# Patient Record
Sex: Female | Born: 1975 | Race: Black or African American | Hispanic: No | Marital: Single | Smoking: Never smoker
Health system: Southern US, Community
[De-identification: ages and names within clinical notes are randomized; demographics above are authoritative.]

## PROBLEM LIST (undated history)

## (undated) DIAGNOSIS — T7840XA Allergy, unspecified, initial encounter: Secondary | ICD-10-CM

## (undated) DIAGNOSIS — K589 Irritable bowel syndrome without diarrhea: Secondary | ICD-10-CM

## (undated) HISTORY — PX: WISDOM TOOTH EXTRACTION: SHX21

## (undated) HISTORY — DX: Irritable bowel syndrome, unspecified: K58.9

## (undated) HISTORY — DX: Allergy, unspecified, initial encounter: T78.40XA

---

## 1998-06-20 ENCOUNTER — Other Ambulatory Visit: Admission: RE | Admit: 1998-06-20 | Discharge: 1998-06-20 | Payer: Self-pay | Admitting: *Deleted

## 1998-12-27 ENCOUNTER — Inpatient Hospital Stay (HOSPITAL_COMMUNITY): Admission: AD | Admit: 1998-12-27 | Discharge: 1998-12-29 | Payer: Self-pay | Admitting: Obstetrics and Gynecology

## 2000-02-25 ENCOUNTER — Encounter (INDEPENDENT_AMBULATORY_CARE_PROVIDER_SITE_OTHER): Payer: Self-pay | Admitting: *Deleted

## 2000-02-25 ENCOUNTER — Other Ambulatory Visit: Admission: RE | Admit: 2000-02-25 | Discharge: 2000-02-25 | Payer: Self-pay | Admitting: *Deleted

## 2001-07-25 ENCOUNTER — Other Ambulatory Visit: Admission: RE | Admit: 2001-07-25 | Discharge: 2001-07-25 | Payer: Self-pay | Admitting: Obstetrics and Gynecology

## 2002-10-05 ENCOUNTER — Encounter: Payer: Self-pay | Admitting: Family Medicine

## 2002-12-31 ENCOUNTER — Encounter: Payer: Self-pay | Admitting: Gastroenterology

## 2002-12-31 ENCOUNTER — Ambulatory Visit (HOSPITAL_COMMUNITY): Admission: RE | Admit: 2002-12-31 | Discharge: 2002-12-31 | Payer: Self-pay | Admitting: Gastroenterology

## 2003-04-18 ENCOUNTER — Other Ambulatory Visit: Admission: RE | Admit: 2003-04-18 | Discharge: 2003-04-18 | Payer: Self-pay | Admitting: Obstetrics and Gynecology

## 2003-12-14 ENCOUNTER — Emergency Department (HOSPITAL_COMMUNITY): Admission: EM | Admit: 2003-12-14 | Discharge: 2003-12-15 | Payer: Self-pay | Admitting: Emergency Medicine

## 2004-01-02 ENCOUNTER — Other Ambulatory Visit: Admission: RE | Admit: 2004-01-02 | Discharge: 2004-01-02 | Payer: Self-pay | Admitting: Family Medicine

## 2005-01-26 ENCOUNTER — Other Ambulatory Visit: Admission: RE | Admit: 2005-01-26 | Discharge: 2005-01-26 | Payer: Self-pay | Admitting: Family Medicine

## 2006-02-22 ENCOUNTER — Other Ambulatory Visit: Admission: RE | Admit: 2006-02-22 | Discharge: 2006-02-22 | Payer: Self-pay | Admitting: Family Medicine

## 2006-08-01 ENCOUNTER — Encounter: Payer: Self-pay | Admitting: Family Medicine

## 2006-09-15 ENCOUNTER — Encounter: Payer: Self-pay | Admitting: Family Medicine

## 2007-03-03 ENCOUNTER — Encounter: Payer: Self-pay | Admitting: Family Medicine

## 2007-03-03 ENCOUNTER — Other Ambulatory Visit: Admission: RE | Admit: 2007-03-03 | Discharge: 2007-03-03 | Payer: Self-pay | Admitting: Family Medicine

## 2008-03-04 ENCOUNTER — Other Ambulatory Visit: Admission: RE | Admit: 2008-03-04 | Discharge: 2008-03-04 | Payer: Self-pay | Admitting: Family Medicine

## 2008-12-25 ENCOUNTER — Encounter: Payer: Self-pay | Admitting: Family Medicine

## 2009-04-10 ENCOUNTER — Other Ambulatory Visit: Admission: RE | Admit: 2009-04-10 | Discharge: 2009-04-10 | Payer: Self-pay | Admitting: Family Medicine

## 2009-04-10 ENCOUNTER — Encounter: Payer: Self-pay | Admitting: Family Medicine

## 2009-09-03 ENCOUNTER — Encounter: Payer: Self-pay | Admitting: Family Medicine

## 2010-04-15 ENCOUNTER — Encounter: Payer: Self-pay | Admitting: Family Medicine

## 2010-04-15 ENCOUNTER — Ambulatory Visit: Payer: Self-pay | Admitting: Family Medicine

## 2010-04-15 ENCOUNTER — Encounter (INDEPENDENT_AMBULATORY_CARE_PROVIDER_SITE_OTHER): Payer: Self-pay | Admitting: *Deleted

## 2010-04-15 ENCOUNTER — Other Ambulatory Visit
Admission: RE | Admit: 2010-04-15 | Discharge: 2010-04-15 | Payer: Self-pay | Source: Home / Self Care | Admitting: Family Medicine

## 2010-04-15 DIAGNOSIS — K589 Irritable bowel syndrome without diarrhea: Secondary | ICD-10-CM

## 2010-04-15 DIAGNOSIS — J309 Allergic rhinitis, unspecified: Secondary | ICD-10-CM | POA: Insufficient documentation

## 2010-04-16 ENCOUNTER — Telehealth: Payer: Self-pay | Admitting: Family Medicine

## 2010-04-16 LAB — CONVERTED CEMR LAB: HIV: NONREACTIVE

## 2010-04-27 LAB — CONVERTED CEMR LAB: Pap Smear: NEGATIVE

## 2010-06-02 ENCOUNTER — Encounter: Payer: Self-pay | Admitting: Gastroenterology

## 2010-06-02 ENCOUNTER — Other Ambulatory Visit: Payer: Self-pay | Admitting: Gastroenterology

## 2010-06-02 ENCOUNTER — Ambulatory Visit
Admission: RE | Admit: 2010-06-02 | Discharge: 2010-06-02 | Payer: Self-pay | Source: Home / Self Care | Attending: Gastroenterology | Admitting: Gastroenterology

## 2010-06-02 DIAGNOSIS — K5900 Constipation, unspecified: Secondary | ICD-10-CM | POA: Insufficient documentation

## 2010-06-02 LAB — CBC WITH DIFFERENTIAL/PLATELET
Basophils Absolute: 0 10*3/uL (ref 0.0–0.1)
Basophils Relative: 0.7 % (ref 0.0–3.0)
Eosinophils Absolute: 0.1 10*3/uL (ref 0.0–0.7)
Eosinophils Relative: 1.4 % (ref 0.0–5.0)
HCT: 33 % — ABNORMAL LOW (ref 36.0–46.0)
Hemoglobin: 11.2 g/dL — ABNORMAL LOW (ref 12.0–15.0)
Lymphocytes Relative: 40.9 % (ref 12.0–46.0)
Lymphs Abs: 1.8 10*3/uL (ref 0.7–4.0)
MCHC: 33.8 g/dL (ref 30.0–36.0)
MCV: 90.7 fl (ref 78.0–100.0)
Monocytes Absolute: 0.4 10*3/uL (ref 0.1–1.0)
Monocytes Relative: 8.2 % (ref 3.0–12.0)
Neutro Abs: 2.1 10*3/uL (ref 1.4–7.7)
Neutrophils Relative %: 48.8 % (ref 43.0–77.0)
Platelets: 307 10*3/uL (ref 150.0–400.0)
RBC: 3.64 Mil/uL — ABNORMAL LOW (ref 3.87–5.11)
RDW: 14.5 % (ref 11.5–14.6)
WBC: 4.4 10*3/uL — ABNORMAL LOW (ref 4.5–10.5)

## 2010-06-02 LAB — COMPREHENSIVE METABOLIC PANEL
ALT: 12 U/L (ref 0–35)
AST: 18 U/L (ref 0–37)
Albumin: 3.9 g/dL (ref 3.5–5.2)
Alkaline Phosphatase: 36 U/L — ABNORMAL LOW (ref 39–117)
BUN: 8 mg/dL (ref 6–23)
CO2: 26 mEq/L (ref 19–32)
Calcium: 8.9 mg/dL (ref 8.4–10.5)
Chloride: 109 mEq/L (ref 96–112)
Creatinine, Ser: 0.7 mg/dL (ref 0.4–1.2)
GFR: 127.42 mL/min (ref >60.00)
Glucose, Bld: 71 mg/dL (ref 70–99)
Potassium: 3.5 mEq/L (ref 3.5–5.1)
Sodium: 139 mEq/L (ref 135–145)
Total Bilirubin: 1.3 mg/dL — ABNORMAL HIGH (ref 0.3–1.2)
Total Protein: 7.7 g/dL (ref 6.0–8.3)

## 2010-06-03 LAB — T4, FREE: Free T4: 0.79 ng/dL (ref 0.60–1.60)

## 2010-06-03 LAB — TSH: TSH: 1.79 u[IU]/mL (ref 0.35–5.50)

## 2010-06-08 ENCOUNTER — Telehealth: Payer: Self-pay | Admitting: Gastroenterology

## 2010-06-10 ENCOUNTER — Encounter: Payer: Self-pay | Admitting: Gastroenterology

## 2010-06-10 ENCOUNTER — Ambulatory Visit
Admission: RE | Admit: 2010-06-10 | Discharge: 2010-06-10 | Payer: Self-pay | Source: Home / Self Care | Attending: Gastroenterology | Admitting: Gastroenterology

## 2010-06-28 LAB — CONVERTED CEMR LAB
HDL: 49 mg/dL
LDL Cholesterol: 112 mg/dL

## 2010-06-30 NOTE — Letter (Signed)
SummaryDeboraha Ray at [facility] at [facility]   Imported By: Lester [place] 05/02/2010 10:55:30  _____________________________________________________________________  External Attachment:    Type:   Image     Comment:   External Document

## 2010-06-30 NOTE — Assessment & Plan Note (Signed)
Summary: new patient, establish care   Vital Signs:  Patient profile:   35 year old female Height:      63.75 inches Weight:      156 pounds BMI:     27.09 Temp:     98.5 degrees F oral Pulse rate:   72 / minute Pulse rhythm:   regular BP sitting:   110 / 80  (right arm) Cuff size:   regular  Vitals Entered By: Linde Gillis CMA Duncan Dull) (April 15, 2010 11:11 AM) CC: new patient, establish care   History of Present Illness: 35 yo here to establish care and for CPX.  G1P1, no h/o abnormal pap smears.  IBS- was diagnosed with IBS several years ago.  Symptoms are mainly chronic constipation.  Had a barium swallow through [facility] 8 years ago, normal.  Fiber supplements do help.   Tried IBS meds, did not help much.  Since July 2010- concerned that her stool has an odor. Color is normal.  no abdominal pain or blood in stool but is becoming very concerning.  Current Medications (verified): 1)  None  Allergies (verified): No Known Drug Allergies  Past History:  Family History: Last updated: 04/15/2010 Unremarkable  Social History: Last updated: 04/15/2010 One son, Heloise Purpura. Recently moved to [place]  Past Medical History: Allergic rhinitis IBS  Past Surgical History: Denies surgical history  Family History: Unremarkable  Social History: One son, Heloise Purpura. Recently moved to [place]  Review of Systems      See HPI General:  Denies fever and malaise. Eyes:  Denies blurring. ENT:  Denies difficulty swallowing. CV:  Denies chest pain or discomfort. Resp:  Denies shortness of breath. GI:  Complains of constipation; denies abdominal pain, nausea, and vomiting. GU:  Denies abnormal vaginal bleeding, discharge, and dysuria. MS:  Denies joint pain, joint redness, and joint swelling. Derm:  Denies rash. Neuro:  Denies headaches. Psych:  Denies anxiety and depression. Endo:  Denies cold intolerance and heat intolerance. Heme:  Denies abnormal bruising and  enlarge lymph nodes.  Physical Exam  General:  Well-developed,well-nourished,in no acute distress; alert,appropriate and cooperative throughout examination Head:  normocephalic and atraumatic.   Eyes:  vision grossly intact, pupils equal, and pupils round.   Ears:  R ear normal and L ear normal.   Nose:  no external deformity.   Mouth:  good dentition.   Neck:  No deformities, masses, or tenderness noted. Lungs:  Normal respiratory effort, chest expands symmetrically. Lungs are clear to auscultation, no crackles or wheezes. Heart:  Normal rate and regular rhythm. S1 and S2 normal without gallop, murmur, click, rub or other extra sounds. Abdomen:  Bowel sounds positive,abdomen soft and non-tender without masses, organomegaly or hernias noted. Rectal:  no hemorrhoids.   Genitalia:  Pelvic Exam:        External: normal female genitalia without lesions or masses        Vagina: normal without lesions or masses        Cervix: normal without lesions or masses        Adnexa: normal bimanual exam without masses or fullness        Uterus: normal by palpation        Pap smear: performed Msk:  No deformity or scoliosis noted of thoracic or lumbar spine.   Extremities:  no edema Neurologic:  alert & oriented X3 and gait normal.   Skin:  Intact without suspicious lesions or rashes Psych:  Cognition and judgment appear intact. Alert and  cooperative with normal attention span and concentration. No apparent delusions, illusions, hallucinations   Impression & Recommendations:  Problem # 1:  Preventive Health Care (ICD-V70.0) Reviewed preventive care protocols, scheduled due services, and updated immunizations Discussed nutrition, exercise, diet, and healthy lifestyle. Pap today with GC/Chlamydia.  Problem # 2:  SCREENING EXAMINATION FOR VENEREAL DISEASE (ICD-V74.5) HIV, RPR. Orders: Venipuncture ([address]) T-HIV Antibody  (Reflex) [id number]) T-RPR (Syphilis) ([id number])  Problem # 3:  IBS  (ICD-564.1) Assessment: Deteriorated GI referral for second opinion. Orders: Gastroenterology Referral (GI)  Patient Instructions: 1)  It was so nice to meet you. 2)  Please say hi to [facility] for me.   3)  Please stop by to see Shirlee Limerick on your way out.   Orders Added: 1)  Venipuncture [13086] 2)  T-HIV Antibody  (Reflex) [[id number]] 3)  T-RPR (Syphilis) [[id number]] 4)  Gastroenterology Referral [GI] 5)  New Patient 18-39 years [[address]]    Prior Medications (reviewed today): None Current Allergies (reviewed today): No known allergies   HDL Result Date:  09/03/2009 HDL Result:  49 LDL Result Date:  09/03/2009 LDL Result:  112   Appended Document: new patient, establish care

## 2010-06-30 NOTE — Letter (Signed)
Summary: New Patient letter  [facility]  [address] [place], [place] [address]   Phone: [phone]  Fax: [phone]       04/15/2010 MRN: [id number]  [facility] [address] [place], [place]  [address]  Dear Ms. Warriner,  Welcome to the Gastroenterology Division at [facility].    You are scheduled to see Dr.  Arlyce Dice on 06-02-10 at 3:45p.m. on the 3rd floor at [facility], [address]  We ask that you try to arrive at our office 15 minutes prior to your appointment time to allow for check-in.  We would like you to complete the enclosed self-administered evaluation form prior to your visit and bring it with you on the day of your appointment.  We will review it with you.  Also, please bring a complete list of all your medications or, if you prefer, bring the medication bottles and we will list them.  Please bring your insurance card so that we may make a copy of it.  If your insurance requires a referral to see a specialist, please bring your referral form from your primary care physician.  Co-payments are due at the time of your visit and may be paid by cash, check or credit card.     Your office visit will consist of a consult with your physician (includes a physical exam), any laboratory testing he/she may order, scheduling of any necessary diagnostic testing (e.g. x-ray, ultrasound, CT-scan), and scheduling of a procedure (e.g. Endoscopy, Colonoscopy) if required.  Please allow enough time on your schedule to allow for any/all of these possibilities.    If you cannot keep your appointment, please call [phone] to cancel or reschedule prior to your appointment date.  This allows [place] the opportunity to schedule an appointment for another patient in need of care.  If you do not cancel or reschedule by 5 p.m. the business day prior to your appointment date, you will be charged a $50.00 late cancellation/no-show fee.    Thank you for choosing  [facility] for your medical needs.  We appreciate the opportunity to care for you.  Please visit [place] at our website  to learn more about our practice.                     Sincerely,                                                             The Gastroenterology Division

## 2010-06-30 NOTE — Progress Notes (Signed)
Summary: ? yeast infection  Phone Note Call from Patient Call back at Work Phone [phone]   Caller: Patient Call For: Ruthe Mannan MD Summary of Call: Patietn was seen yesterday and had pelvic exam and she says she was asked about concerns of a yeast infection and now is having some of these symptoms. Want something called in to [facility] [address] Initial call taken by: Benny Lennert CMA Duncan Dull),  April 16, 2010 1:41 PM    New/Updated Medications: DIFLUCAN 150 MG TABS (FLUCONAZOLE) once daily Prescriptions: DIFLUCAN 150 MG TABS (FLUCONAZOLE) once daily  #1 x 0   Entered and Authorized by:   Ruthe Mannan MD   Signed by:   Ruthe Mannan MD on 04/16/2010   Method used:   Electronically to        [facility] [phone]* (retail)       [address]       [place], [place]  [address]       Ph: [phone]       Fax: [phone]   RxID:   [id number] DIFLUCAN 150 MG TABS (FLUCONAZOLE) once daily  #1 x 0   Entered and Authorized by:   Ruthe Mannan MD   Signed by:   Ruthe Mannan MD on 04/16/2010   Method used:   Electronically to        [facility] (retail)       [address], [address]       [place], [place]  [address]       Ph: [phone]       Fax: [phone]   RxID:   [id number]

## 2010-06-30 NOTE — Letter (Signed)
Summary: [facility]  [facility]   Imported By: Lester [place] 05/07/2010 12:51:44  _____________________________________________________________________  External Attachment:    Type:   Image     Comment:   External Document

## 2010-06-30 NOTE — Letter (Signed)
Summary: Sigmund Hazel MD  Sigmund Hazel MD   Imported By: Lester [place] 05/02/2010 10:56:27  _____________________________________________________________________  External Attachment:    Type:   Image     Comment:   External Document

## 2010-06-30 NOTE — Letter (Signed)
Summary: Sigmund Hazel MD  Sigmund Hazel MD   Imported By: Lester [place] 05/02/2010 10:58:31  _____________________________________________________________________  External Attachment:    Type:   Image     Comment:   External Document

## 2010-06-30 NOTE — Letter (Signed)
Summary: Suzanne Ray records 2004  [facility] records 2004   Imported By: Lester [place] 05/07/2010 12:53:59  _____________________________________________________________________  External Attachment:    Type:   Image     Comment:   External Document

## 2010-06-30 NOTE — Letter (Signed)
Summary: Sigmund Hazel MD  Sigmund Hazel MD   Imported By: Lester [place] 05/02/2010 10:57:17  _____________________________________________________________________  External Attachment:    Type:   Image     Comment:   External Document

## 2010-07-02 NOTE — Letter (Signed)
Summary: Appt Reminder 2  [facility]  [address] [place], [place] [address]   Phone: [phone]  Fax: [phone]        June 10, 2010 MRN: [id number]    Suzanne Ray [address] [place], [place]  [address]    Dear [name],   You have a return appointment with Dr. Arlyce Dice on 07/17/10 at 9:45am.  Please remember to bring a complete list of the medicines you are taking, your insurance card and your co-pay.  If you have to cancel or reschedule this appointment, please call before 5:00 pm the evening before to avoid a cancellation fee.  If you have any questions or concerns, please call [phone].    Sincerely,    Selinda Michaels RN  Appended Document: Appt Reminder 2 Letter is mailed to the patient's home address

## 2010-07-02 NOTE — Procedures (Signed)
Summary: Colonoscopy  Patient: Genifer Lazenby Note: All result statuses are Final unless otherwise noted.  Tests: (1) Colonoscopy (COL)   COL Colonoscopy           DONE     [facility]     [address]     [place], [place]  [address]           COLONOSCOPY PROCEDURE REPORT           PATIENT:  Paullette, Mckain  MR#:  [id number]     BIRTHDATE:  06-04-1975, 34 yrs. old  GENDER:  female           ENDOSCOPIST:  Barbette Hair. Arlyce Dice, MD     Referred by:  Ruthe Mannan, M.D.           PROCEDURE DATE:  06/10/2010     PROCEDURE:  Diagnostic Colonoscopy     ASA CLASS:  Class I     INDICATIONS:  1) constipation           MEDICATIONS:   MAC sedation, administered by CRNA           DESCRIPTION OF PROCEDURE:   After the risks benefits and     alternatives of the procedure were thoroughly explained, informed     consent was obtained.  Digital rectal exam was performed and     revealed no abnormalities.   The LB [id number] endoscope     was introduced through the anus and advanced to the cecum, which     was identified by the ileocecal valve, without limitations.  The     quality of the prep was excellent, using MoviPrep.  The instrument     was then slowly withdrawn as the colon was fully examined.     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   Retroflexed views in the     rectum revealed no abnormalities.    The time to cecum =  3.0     minutes. The scope was then withdrawn (time =  6.50  min) from the     patient and the procedure completed.           COMPLICATIONS:  None           ENDOSCOPIC IMPRESSION:     1) Melanosis     2) Otherwise normal examination     RECOMMENDATIONS:     1) pt will consider enrollment in a constipation study     2) call office next  1-3 days to schedule followup visit in 3-4     weeks           REPEAT EXAM:  No           ______________________________     Barbette Hair. Arlyce Dice, MD           CC:           n.     eSIGNED:   Barbette Hair. Alden Feagan at 06/10/2010 02:53 PM           Lily Peer, [id number]  Note: An exclamation mark (!) indicates a result that was not dispersed into the flowsheet. Document Creation Date: 06/10/2010 2:53 PM _______________________________________________________________________  (1)  Order result status: Final Collection or observation date-time: 06/10/2010 14:47 Requested date-time:  Receipt date-time:  Reported date-time:  Referring Physician:   Ordering Physician: Melvia Heaps [phone]) Specimen Source:  Source: Launa Grill Order Number: [id number] Lab site:

## 2010-07-02 NOTE — Progress Notes (Signed)
Summary: Medication  Phone Note Call from Patient Call back at Work Phone [phone]   Caller: Patient Call For: Dr. Arlyce Dice Reason for Call: Talk to Nurse Summary of Call: Pt needs colon prep called in to [facility] [address] Initial call taken by: [place] Johnson,  June 08, 2010 9:35 AM    New/Updated Medications: MOVIPREP 100 GM SOLR (PEG-KCL-NACL-NASULF-NA ASC-C) Take as directed Prescriptions: MOVIPREP 100 GM SOLR (PEG-KCL-NACL-NASULF-NA ASC-C) Take as directed  #1 x 0   Entered by:   Selinda Michaels RN   Authorized by:   Louis Meckel MD   Signed by:   Selinda Michaels RN on 06/08/2010   Method used:   Electronically to        [facility] [phone]* (retail)       [address]       [place], [place]  [address]       Ph: [phone]       Fax: [phone]   RxID:   [id number]

## 2010-07-02 NOTE — Letter (Signed)
Summary: Results Letter   Gastroenterology  [address] [place], [place] [address]   Phone: [phone]  Fax: [phone]        June 02, 2010 MRN: [id number]    EXILDA WILHITE [address] [place], [place]  [address]    Dear Ms. Birt,  It is my pleasure to have treated you recently as a new patient in my office. I appreciate your confidence and the opportunity to participate in your care.  Since I do have a busy inpatient endoscopy schedule and office schedule, my office hours vary weekly. I am, however, available for emergency calls everyday through my office. If I am not available for an urgent office appointment, another one of our gastroenterologist will be able to assist you.  My well-trained staff are prepared to help you at all times. For emergencies after office hours, a physician from our Gastroenterology section is always available through my 24 hour answering service  Once again I welcome you as a new patient and I look forward to a happy and healthy relationship             Sincerely,  Louis Meckel MD  This letter has been electronically signed by your physician.  Appended Document: Results Letter letter mailed

## 2010-07-02 NOTE — Assessment & Plan Note (Signed)
Summary: IBS--ch.   History of Present Illness Visit Type: Initial Consult Primary GI MD: Melvia Heaps MD [facility] Primary Provider: Ruthe Mannan, MD Requesting Provider: Ruthe Mannan, MD Chief Complaint: Patient complains of constipation now she has no urge to have a BM. She feels like she has delayed gastric emptying but no dx. She has some hemrrhoids and gas and bloating. She also complains of alot of epigastric buring and pain History of Present Illness:   [name] is a pleasant 35 year old Afro-American female referred at the request of Dr. Jovita Gamma for evaluation of constipation.  This has ben  a problem for years and is worsening.  She may go 3-4 weeks without  the urge to move her bowels.  With constipation she develops abdominal fullness and postprandial burning discomfort in her lower abdomen.  On occasion she has had minimal amounts of blood per rectum.  She complains of excess gas and bloating.  She has taken amitiza in the past without relief although she claims that she has not taken it regularly.  A barium enema from 2004 was normal.   GI Review of Systems    Reports abdominal pain, acid reflux, belching, bloating, loss of appetite, and  nausea.     Location of  Abdominal pain: epigastric area.    Denies chest pain, dysphagia with liquids, dysphagia with solids, heartburn, vomiting, vomiting blood, weight loss, and  weight gain.      Reports constipation, hemorrhoids, and  irritable bowel syndrome.     Denies anal fissure, black tarry stools, change in bowel habit, diarrhea, diverticulosis, fecal incontinence, heme positive stool, jaundice, light color stool, liver problems, rectal bleeding, and  rectal pain. Preventive Screening-Counseling & Management  Alcohol-Tobacco     Smoking Status: never      Drug Use:  no.      Current Medications (verified): 1)  Nullo 100 Mg Tabs (Chlorophyll) .... Take One By Mouth Three Times A Week.  Allergies (verified): No Known Drug  Allergies  Past History:  Past Medical History: Reviewed history from 04/15/2010 and no changes required. Allergic rhinitis IBS  Past Surgical History: Reviewed history from 04/15/2010 and no changes required. Denies surgical history  Family History: Reviewed history from 04/15/2010 and no changes required. Unremarkable No FH of Colon Cancer:  Social History: One son, Suzanne Ray. 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  Alcohol Use - no Daily Caffeine Use 2 per day Illicit Drug Use - no Smoking Status:  never Drug Use:  no  Review of Systems       The patient complains of fatigue, menstrual pain, shortness of breath, skin rash, and thirst - excessive.  The patient denies allergy/sinus, anemia, anxiety-new, arthritis/joint pain, back pain, blood in urine, breast changes/lumps, change in vision, confusion, cough, coughing up blood, depression-new, fainting, fever, headaches-new, hearing problems, heart murmur, heart rhythm changes, itching, muscle pains/cramps, night sweats, nosebleeds, pregnancy symptoms, sleeping problems, sore throat, swelling of feet/legs, swollen lymph glands, thirst - excessive , urination - excessive , urination changes/pain, urine leakage, vision changes, and voice change.         All other systems were reviewed and were negative   Vital Signs:  Patient profile:   35 year old female Height:      63.75 inches Weight:      158.4 pounds BMI:     27.50 Pulse rate:   70 / minute Pulse rhythm:   regular BP sitting:   100 / 66  (  left arm) Cuff size:   regular  Vitals Entered By: Harlow Mares CMA Duncan Dull) (June 02, 2010 4:12 PM)  Physical Exam  Additional Exam:  On physical exam she is a well-developed well-nourished female  skin: anicteric HEENT: normocephalic; PEERLA; no nasal or pharyngeal abnormalities neck: supple nodes: no cervical lymphadenopathy chest: clear to ausculatation and percussion heart: no  murmurs, gallops, or rubs abd: soft, nontender; BS normoactive; no abdominal masses, tenderness, organomegaly rectal: deferred ext: no cynanosis, clubbing, edema skeletal: no deformities neuro: oriented x 3; no focal abnormalities    Impression & Recommendations:  Problem # 1:  CONSTIPATION (ICD-564.00)  This most likely is due to functional constipation.  A structural abnormality  including IBD should be ruled out.  Rarely inflammation can cause constipation rather than diarrhea.  Hirschsprung's disease is unlikely.  Clinically she does not appear to be hypothyroid.  Recommendations #1 check thyroid function test, CBC and comprehensive metabolic profile #2 colonoscopy; the patient will be sedated with propofol #3 if numbers one and 2 are normal I will work on a bowel regimen with her.  She will also consider enrollment  in a constipation trial  Other Orders: TLB-CBC Platelet - w/Differential (85025-CBCD) TLB-CMP (Comprehensive Metabolic Pnl) (80053-COMP) TLB-TSH (Thyroid Stimulating Hormone) (84443-TSH) TLB-T4 (Thyrox), Free [phone]) Colonoscopy (Colon)  Patient Instructions: 1)  Copy sent to : Ruthe Mannan, MD 2)  Your Colonoscopy is scheduled on 06/10/2010 at 2:30pm 3)  We will send in yourMoviPrep to your pharmacy today 4)  Colonoscopy and Flexible Sigmoidoscopy brochure given.  5)  Conscious Sedation brochure given.  6)  The medication list was reviewed and reconciled.  All changed / newly prescribed medications were explained.  A complete medication list was provided to the patient / caregiver.

## 2010-07-02 NOTE — Letter (Signed)
Summary: [facility] Instructions  [facility]  [address] [place], [place] [address]   Phone: [phone]  Fax: [phone]       Suzanne Ray    10/10/1975    MRN: [id number]        Procedure Day /Date:WEDNESDAY 06/10/2010     Arrival Time:1:30PM     Procedure Time:2:30PM     Location of Procedure:                    X   [facility] (4th Floor)  PREPARATION FOR COLONOSCOPY WITH MOVIPREP   Starting 5 days prior to your procedure 06/05/2010 do not eat nuts, seeds, popcorn, corn, beans, peas,  salads, or any raw vegetables.  Do not take any fiber supplements (e.g. Metamucil, Citrucel, and Benefiber).  THE DAY BEFORE YOUR PROCEDURE         DATE:06/09/2010  DAY: TUESDAY  1.  Drink clear liquids the entire day-NO SOLID FOOD  2.  Do not drink anything colored red or purple.  Avoid juices with pulp.  No orange juice.  3.  Drink at least 64 oz. (8 glasses) of fluid/clear liquids during the day to prevent dehydration and help the prep work efficiently.  CLEAR LIQUIDS INCLUDE: Water Jello Ice Popsicles Tea (sugar ok, no milk/cream) Powdered fruit flavored drinks Coffee (sugar ok, no milk/cream) Gatorade Juice: apple, white grape, white cranberry  Lemonade Clear bullion, consomm, broth Carbonated beverages (any kind) Strained chicken noodle soup Hard Candy                             4.  In the morning, mix first dose of MoviPrep solution:    Empty 1 Pouch A and 1 Pouch B into the disposable container    Add lukewarm drinking water to the top line of the container. Mix to dissolve    Refrigerate (mixed solution should be used within 24 hrs)  5.  Begin drinking the prep at 5:00 p.m. The MoviPrep container is divided by 4 marks.   Every 15 minutes drink the solution down to the next mark (approximately 8 oz) until the full liter is complete.   6.  Follow completed prep with 16 oz of clear liquid of your choice (Nothing red or purple).  Continue  to drink clear liquids until bedtime.  7.  Before going to bed, mix second dose of MoviPrep solution:    Empty 1 Pouch A and 1 Pouch B into the disposable container    Add lukewarm drinking water to the top line of the container. Mix to dissolve    Refrigerate  THE DAY OF YOUR PROCEDURE      DATE:06/10/2010 DAY: WEDNESDAY  Beginning at9:30a.m. (5 hours before procedure):         1. Every 15 minutes, drink the solution down to the next mark (approx 8 oz) until the full liter is complete.  2. Follow completed prep with 16 oz. of clear liquid of your choice.    3. You may drink clear liquids until 12:30PM (2 HOURS BEFORE PROCEDURE).   MEDICATION INSTRUCTIONS  Unless otherwise instructed, you should take regular prescription medications with a small sip of water   as early as possible the morning of your procedure.          OTHER INSTRUCTIONS  You will need a responsible adult at least 34 years of age to accompany you and drive you  home.   This person must remain in the waiting room during your procedure.  Wear loose fitting clothing that is easily removed.  Leave jewelry and other valuables at home.  However, you may wish to bring a book to read or  an iPod/MP3 player to listen to music as you wait for your procedure to start.  Remove all body piercing jewelry and leave at home.  Total time from sign-in until discharge is approximately 2-3 hours.  You should go home directly after your procedure and rest.  You can resume normal activities the  day after your procedure.  The day of your procedure you should not:   Drive   Make legal decisions   Operate machinery   Drink alcohol   Return to work  You will receive specific instructions about eating, activities and medications before you leave.    The above instructions have been reviewed and explained to me by   _______________________    I fully understand and can verbalize these instructions  _____________________________ Date _________

## 2010-12-16 ENCOUNTER — Encounter: Payer: Self-pay | Admitting: Family Medicine

## 2010-12-16 LAB — HM PAP SMEAR

## 2010-12-16 LAB — HM COLONOSCOPY

## 2010-12-17 ENCOUNTER — Other Ambulatory Visit: Payer: Self-pay | Admitting: Family Medicine

## 2010-12-17 ENCOUNTER — Encounter: Payer: Self-pay | Admitting: Family Medicine

## 2010-12-17 ENCOUNTER — Ambulatory Visit (INDEPENDENT_AMBULATORY_CARE_PROVIDER_SITE_OTHER): Payer: Managed Care, Other (non HMO) | Admitting: Family Medicine

## 2010-12-17 VITALS — BP 130/80 | HR 76 | Temp 97.5°F | Wt 159.0 lb

## 2010-12-17 DIAGNOSIS — R195 Other fecal abnormalities: Secondary | ICD-10-CM

## 2010-12-17 DIAGNOSIS — K5900 Constipation, unspecified: Secondary | ICD-10-CM

## 2010-12-17 NOTE — Progress Notes (Signed)
35 yo here to discuss constipation and abnormal stool color and systemic odor.  Saw GI earlier this year, diagnosed with constipation. Colonoscopy was unremarkable in 05/2010. Offered clinical constipation study but she was not willing to go on OCPs for the study.  Constipation improved with Magnesium supplements but still fills like she give out a strange odor, not necessarily in her stool. She feels it is coming out of her pours. Worse after she eats proteins, meats, fish.  Stool sometimes has a lot of mucous in it. No abdominal pain.   Patient Active Problem List  Diagnoses   ALLERGIC RHINITIS   IBS   CONSTIPATION   Past Medical History  Diagnosis Date   Allergy    IBS (irritable bowel syndrome)    No past surgical history on file. History  Substance Use Topics   Smoking status: Never Smoker    Smokeless tobacco: Not on file   Alcohol Use: No   No family history on file. Allergies not on file Current Outpatient Prescriptions on File Prior to Visit  Medication Sig Dispense Refill   Chlorophyll (O) 100 MG TABS Take one tablet by mouth three times a week        The PMH, PSH, Social History, Family History, Medications, and allergies have been reviewed in [facility], and have been updated if relevant.  Review of Systems       See HPI General:  Denies fever and malaise. Eyes:  Denies blurring. ENT:  Denies difficulty swallowing. CV:  Denies chest pain or discomfort. Resp:  Denies shortness of breath. GI:  Complains of constipation; denies abdominal pain, nausea, and vomiting. GU:  Denies abnormal vaginal bleeding, discharge, and dysuria. MS:  Denies joint pain, joint redness, and joint swelling. Derm:  Denies rash. Neuro:  Denies headaches. Psych:  Denies anxiety and depression. Endo:  Denies cold intolerance and heat intolerance. Heme:  Denies abnormal bruising and enlarge lymph nodes.  Physical Exam BP 130/80   Pulse 76   Temp(Src) 97.5 F (36.4 C)  (Oral)   Wt 159 lb (72.122 kg)  General:  Well-developed,well-nourished,in no acute distress; alert,appropriate and cooperative throughout examination Head:  normocephalic and atraumatic.   Eyes:  vision grossly intact, pupils equal, and pupils round.   Ears:  R ear normal and L ear normal.   Nose:  no external deformity.   Mouth:  good dentition.   Neck:  No deformities, masses, or tenderness noted. Lungs:  Normal respiratory effort, chest expands symmetrically. Lungs are clear to auscultation, no crackles or wheezes. Heart:  Normal rate and regular rhythm. S1 and S2 normal without gallop, murmur, click, rub or other extra sounds. Abdomen:  Bowel sounds positive,abdomen soft and non-tender without masses, organomegaly or hernias noted. Msk:  No deformity or scoliosis noted of thoracic or lumbar spine.   Extremities:  no edema Neurologic:  alert & oriented X3 and gait normal.   Skin:  Intact without suspicious lesions or rashes Psych:  Cognition and judgment appear intact. Alert and cooperative with normal attention span and concentration. No apparent delusions, illusions, hallucinations  Assessment and Plan: 1. CONSTIPATION   with abnormal body odor. I cannot appreciate abnormal odor. I question if she has some type of protein or fat malabsorption issue. Has follow up scheduled with Dr. Arlyce Dice in September. Will order fecal fat, LFTs and BMet today. Orders Placed This Encounter  Procedures   Fecal fat, qualtitative   Hepatic function panel   Basic Metabolic Panel (BMET)

## 2010-12-17 NOTE — Patient Instructions (Signed)
Great to see you. I will be in touch by the end of the week!

## 2010-12-18 LAB — HEPATIC FUNCTION PANEL
Alkaline Phosphatase: 36 U/L — ABNORMAL LOW (ref 39–117)
Bilirubin, Direct: 0 mg/dL (ref 0.0–0.3)
Total Bilirubin: 0.5 mg/dL (ref 0.3–1.2)

## 2010-12-18 LAB — BASIC METABOLIC PANEL
BUN: 12 mg/dL (ref 6–23)
CO2: 27 mEq/L (ref 19–32)
Chloride: 107 mEq/L (ref 96–112)
Creatinine, Ser: 0.9 mg/dL (ref 0.4–1.2)
Potassium: 3.6 mEq/L (ref 3.5–5.1)

## 2011-02-03 ENCOUNTER — Ambulatory Visit (INDEPENDENT_AMBULATORY_CARE_PROVIDER_SITE_OTHER): Payer: Managed Care, Other (non HMO) | Admitting: Gastroenterology

## 2011-02-03 ENCOUNTER — Encounter: Payer: Self-pay | Admitting: Gastroenterology

## 2011-02-03 DIAGNOSIS — K5900 Constipation, unspecified: Secondary | ICD-10-CM

## 2011-02-03 DIAGNOSIS — K3189 Other diseases of stomach and duodenum: Secondary | ICD-10-CM | POA: Insufficient documentation

## 2011-02-03 DIAGNOSIS — R1013 Epigastric pain: Secondary | ICD-10-CM

## 2011-02-03 NOTE — Assessment & Plan Note (Addendum)
Abdominal discomfort could be due to ulcer or nonulcer dyspepsia.  Recommendations #1 upper endoscopy #2 gastric emptying scan if endoscopy is negative

## 2011-02-03 NOTE — Progress Notes (Signed)
History of Present Illness:  Suzanne Ray has returned for ongoing complaints of constipation and upper abdominal discomfort. She complains of postprandial fullness with occasional nausea and upper abdominal discomfort. She denies pyrosis, per se. She is concerned about H. pylori infection. On daily magnesium supplementation she is moving her bowels slightly more frequently. She still concerned about abdominal pain when she eats certain foods or drinks alcohol. Recent stool studies were negative for fecal fats    Review of Systems: Pertinent positive and negative review of systems were noted in the above HPI section. All other review of systems were otherwise negative.    Current Medications, Allergies, Past Medical History, Past Surgical History, Family History and Social History were reviewed in [employer] electronic medical record  Vital signs were reviewed in today's medical record. Physical Exam: General: Well developed , well nourished, no acute distress

## 2011-02-03 NOTE — Assessment & Plan Note (Signed)
Patient will consider enrollment in IBS trial

## 2011-02-03 NOTE — Patient Instructions (Signed)
Upper GI Endoscopy Upper GI endoscopy means using a flexible scope to look at the esophagus, stomach and upper small bowel. This is done to make a diagnosis in people with heartburn, abdominal pain, or abnormal bleeding. Sometimes an endoscope is needed to remove foreign bodies or food that become stuck in the esophagus; it can also be used to take biopsy samples. For the best results, do not eat or drink for 8 hours before having your upper endoscopy.  To perform the endoscopy, you will probably be sedated and your throat will be numbed with a special spray. The endoscope is then slowly passed down your throat (this will not interfere with your breathing). An endoscopy exam takes 15-30 minutes to complete and there is no real pain. Patients rarely remember much about the procedure. The results of the test may take several days if a biopsy or other test is taken.  You may have a sore throat after an endoscopy exam. Serious complications are very rare. Stick to liquids and soft foods until your pain is better. You should not drive a car or operate any dangerous equipment for at least 24 hours after being sedated. SEEK IMMEDIATE MEDICAL CARE IF:  You have severe throat pain.   You have shortness of breath.   You have bleeding problems.   You have a fever.   You have difficulty recovering from your sedation.  Document Released: 06/24/2004 Document Re-Released: 08/11/2009 [facility] Patient Information 2011 [place], [place]. Your Endoscopy is scheduled for 02/04/2011 at 11am

## 2011-02-04 ENCOUNTER — Other Ambulatory Visit: Payer: Self-pay | Admitting: Gastroenterology

## 2011-02-04 ENCOUNTER — Ambulatory Visit (AMBULATORY_SURGERY_CENTER): Payer: Managed Care, Other (non HMO) | Admitting: Gastroenterology

## 2011-02-04 ENCOUNTER — Encounter: Payer: Self-pay | Admitting: Gastroenterology

## 2011-02-04 DIAGNOSIS — K5900 Constipation, unspecified: Secondary | ICD-10-CM

## 2011-02-04 DIAGNOSIS — R1013 Epigastric pain: Secondary | ICD-10-CM

## 2011-02-04 MED ORDER — SODIUM CHLORIDE 0.9 % IV SOLN: 500.0000 mL | INTRAVENOUS | Status: DC

## 2011-02-04 MED ORDER — HYOSCYAMINE SULFATE ER 0.375 MG PO TBCR: EXTENDED_RELEASE_TABLET | ORAL | Status: DC

## 2011-02-04 NOTE — Patient Instructions (Signed)
See blue and green sheets for additional d/c instructions.

## 2011-02-05 ENCOUNTER — Telehealth: Payer: Self-pay

## 2011-02-05 NOTE — Telephone Encounter (Signed)
No answering. Phone rang numerous times no answer.

## 2011-02-05 NOTE — Telephone Encounter (Signed)
Pt scheduled for GES at [facility] 02/23/11 @ 8am, arrival time 7:45am. Pt to be NPO after midnight. Pt aware of appt date and time.

## 2011-02-18 ENCOUNTER — Ambulatory Visit (INDEPENDENT_AMBULATORY_CARE_PROVIDER_SITE_OTHER): Payer: Managed Care, Other (non HMO) | Admitting: Family Medicine

## 2011-02-18 ENCOUNTER — Encounter: Payer: Self-pay | Admitting: Family Medicine

## 2011-02-18 VITALS — BP 96/72 | HR 66 | Temp 98.3°F | Wt 160.0 lb

## 2011-02-18 DIAGNOSIS — N760 Acute vaginitis: Secondary | ICD-10-CM

## 2011-02-18 DIAGNOSIS — Z113 Encounter for screening for infections with a predominantly sexual mode of transmission: Secondary | ICD-10-CM | POA: Insufficient documentation

## 2011-02-18 LAB — POCT URINE PREGNANCY: Preg Test, Ur: NEGATIVE

## 2011-02-18 LAB — POCT URINALYSIS DIPSTICK
Blood, UA: NEGATIVE
Glucose, UA: NEGATIVE
Nitrite, UA: NEGATIVE
Protein, UA: NEGATIVE
Urobilinogen, UA: 0.2

## 2011-02-18 MED ORDER — FLUCONAZOLE 150 MG PO TABS: 150.0000 mg | ORAL_TABLET | Freq: Once | ORAL | Status: AC

## 2011-02-18 NOTE — Patient Instructions (Addendum)
Good to see you.  Monilial Vaginitis (Yeast Infections) Vaginitis in a soreness, swelling and redness (inflammation) of the vagina and vulva. Monilial vaginitis is not a sexually transmitted infection.  CAUSES Yeast vaginitis is caused by yeast (candida) that is normally found in your vagina. With a yeast infection, the candida has overgrown in number to a point that upsets the chemical balance. SYMPTOMS   White thick vaginal discharge.   Swelling, itching, redness and irritation of the vagina and possibly the lips of the vagina (vulva).   Burning or painful urination.   Painful intercourse.  DIAGNOSIS Things that may contribute to monilial vaginitis are:  Postmenopausal and virginal states.  Pregnancy.   Infections.   Being tired, sick or stressed, especially if you had monilial vaginitis in the past.   Diabetes. Good control will help lower the chance.   Birth control pills.  Tight fitting garments.   Using bubble bath, feminine sprays, douches or deodorant tampons.   Taking certain medications that kill germs (antibiotics).   Sporadic recurrence can occur if you become ill.   TREATMENT  Your caregiver will give you medication.  There are several kinds of anti monilial vaginal creams and suppositories specific for monilial vaginitis. For recurrent yeast infections, use a suppository or cream in the vagina 2 times a week, or as directed.   Anti-monilial or steroid cream for the itching or irritation of the vulva may also be used. Get your caregiver's permission.   Painting the vagina with methylene blue solution may help if the monilial cream does not work.   Eating yogurt may help prevent monilial vaginitis.  HOME CARE INSTRUCTIONS  Finish all medication as prescribed.   Do not have sex until treatment is completed or after your caregiver tells you it is okay.   Take warm sitz baths.   Do not douche.   Do not use tampons, especially scented ones.   Wear  cotton underwear.   Avoid tight pants and panty hose.   Tell your sexual partner that you have a yeast infection. They should go to their caregiver if they have symptoms such as mild rash or itching.   Your sexual partner should be treated as well if your infection is difficult to eliminate.   Practice safer sex. Use condoms.   Some vaginal medications cause latex condoms to fail. Vaginal medications that harm condoms are:   Cleocin cream.   Butoconazole (Femstat).   Terconazole (Terazol) vaginal suppository.   Miconazole (Monistat) (may be purchased over the counter).  SEEK MEDICAL CARE IF:  You have a temperature by mouth above 100.5.   The infection is getting worse after 2 days of treatment.   The infection is not getting better after 3 days of treatment.   You develop blisters in or around your vagina.   You develop vaginal bleeding, and it is not your menstrual period.   You have pain when you urinate.   You develop intestinal problems.   You have pain with sexual intercourse.  Document Released: 02/24/2005 Document Re-Released: 08/11/2009 [employer] Patient Information 2011 [employer]. 

## 2011-02-18 NOTE — Progress Notes (Signed)
SUBJECTIVE:  35 y.o. female complains of creamy vaginal discharge and vulvular irritation for 1 month(s). Denies abnormal vaginal bleeding or significant pelvic pain or fever. No UTI symptoms. Denies history of known exposure to STD.  Patient's last menstrual period was 01/08/2011.  Patient Active Problem List  Diagnoses   ALLERGIC RHINITIS   IBS   CONSTIPATION   Dyspepsia and other specified disorders of function of stomach   Past Medical History  Diagnosis Date   Allergy    IBS (irritable bowel syndrome)    No past surgical history on file. History  Substance Use Topics   Smoking status: Never Smoker    Smokeless tobacco: Not on file   Alcohol Use: No   No family history on file. No Known Allergies Current Outpatient Prescriptions on File Prior to Visit  Medication Sig Dispense Refill   Alum & Mag Hydroxide-Simeth (MAG-AL PLUS XS PO) Take by mouth 2 (two) times daily at 10 AM and 5 PM. MAG/PHOSPHATE THREE TIMES A WEEK        Chlorophyll (O) 100 MG TABS Take one tablet by mouth three times a week        Current Facility-Administered Medications on File Prior to Visit  Medication Dose Route Frequency Provider Last Rate Last Dose   0.9 %  sodium chloride infusion  500 mL Intravenous Continuous Louis Meckel, MD       The PMH, PSH, Social History, Family History, Medications, and allergies have been reviewed in [facility], and have been updated if relevant.  OBJECTIVE:  BP 96/72   Pulse 66   Temp(Src) 98.3 F (36.8 C) (Oral)   Wt 160 lb (72.576 kg)   LMP 01/08/2011  She appears well, afebrile. Abdomen: benign, soft, nontender, no masses. Pelvic Exam: normal external genitalia, vulva, vagina, cervix, uterus and adnexa. UA neg.    ASSESSMENT:  C. albicans vulvovaginitis  PLAN:  GC and chlamydia DNA  probe sent to lab. HIV, RPR. Treatment: abstain from coitus during course of treatment and Diflucan 150 mg x 1.   ROV prn if symptoms persist or worsen.

## 2011-02-19 LAB — GC/CHLAMYDIA PROBE AMP, GENITAL: GC Probe Amp, Genital: NEGATIVE

## 2011-02-19 LAB — RPR

## 2011-02-19 LAB — HIV ANTIBODY (ROUTINE TESTING W REFLEX): HIV: NONREACTIVE

## 2011-02-23 ENCOUNTER — Other Ambulatory Visit (HOSPITAL_COMMUNITY): Payer: Managed Care, Other (non HMO)

## 2011-03-01 ENCOUNTER — Other Ambulatory Visit (HOSPITAL_COMMUNITY): Payer: Managed Care, Other (non HMO)

## 2011-03-09 ENCOUNTER — Ambulatory Visit: Payer: Managed Care, Other (non HMO) | Admitting: Gastroenterology

## 2011-03-29 ENCOUNTER — Encounter (HOSPITAL_COMMUNITY)
Admission: RE | Admit: 2011-03-29 | Discharge: 2011-03-29 | Disposition: A | Discharge status: Home / Self Care | Payer: Managed Care, Other (non HMO) | Source: Ambulatory Visit | Attending: Gastroenterology | Admitting: Gastroenterology

## 2011-03-29 DIAGNOSIS — R109 Unspecified abdominal pain: Secondary | ICD-10-CM | POA: Insufficient documentation

## 2011-03-29 MED ORDER — TECHNETIUM TC 99M SULFUR COLLOID
2.0000 | Freq: Once | INTRAVENOUS | Status: AC | PRN
  Administered 2011-03-29: 2 via INTRAVENOUS

## 2011-03-30 ENCOUNTER — Telehealth: Payer: Self-pay

## 2011-03-30 NOTE — Telephone Encounter (Signed)
Message copied by Michele Mcalpine on Tue Mar 30, 2011  1:51 PM ------      Message from: Suzanne Ray      Created: Tue Mar 30, 2011 12:06 PM       Please inform the patient that GES was normal and to continue current plan of action

## 2011-03-30 NOTE — Progress Notes (Signed)
Quick Note:  Please inform the patient that GES was normal and to continue current plan of action ______ 

## 2011-03-30 NOTE — Telephone Encounter (Signed)
Pt aware.

## 2011-04-26 ENCOUNTER — Encounter: Payer: Self-pay | Admitting: Family Medicine

## 2011-04-26 ENCOUNTER — Ambulatory Visit (INDEPENDENT_AMBULATORY_CARE_PROVIDER_SITE_OTHER): Payer: Managed Care, Other (non HMO) | Admitting: Family Medicine

## 2011-04-26 VITALS — BP 102/70 | HR 72 | Temp 98.1°F | Ht 63.0 in | Wt 160.5 lb

## 2011-04-26 DIAGNOSIS — E559 Vitamin D deficiency, unspecified: Secondary | ICD-10-CM | POA: Insufficient documentation

## 2011-04-26 DIAGNOSIS — Z0000 Encounter for general adult medical examination without abnormal findings: Secondary | ICD-10-CM

## 2011-04-26 DIAGNOSIS — Z136 Encounter for screening for cardiovascular disorders: Secondary | ICD-10-CM

## 2011-04-26 DIAGNOSIS — D649 Anemia, unspecified: Secondary | ICD-10-CM

## 2011-04-26 LAB — CBC WITH DIFFERENTIAL/PLATELET
Basophils Absolute: 0 10*3/uL (ref 0.0–0.1)
Eosinophils Absolute: 0.1 10*3/uL (ref 0.0–0.7)
Eosinophils Relative: 2.3 % (ref 0.0–5.0)
Lymphs Abs: 1.3 10*3/uL (ref 0.7–4.0)
MCV: 91.1 fl (ref 78.0–100.0)
Monocytes Absolute: 0.5 10*3/uL (ref 0.1–1.0)
Neutrophils Relative %: 52.8 % (ref 43.0–77.0)
Platelets: 271 10*3/uL (ref 150.0–400.0)
RDW: 14.9 % — ABNORMAL HIGH (ref 11.5–14.6)
WBC: 4 10*3/uL — ABNORMAL LOW (ref 4.5–10.5)

## 2011-04-26 LAB — BASIC METABOLIC PANEL
BUN: 10 mg/dL (ref 6–23)
CO2: 24 mEq/L (ref 19–32)
Chloride: 107 mEq/L (ref 96–112)
Creatinine, Ser: 0.9 mg/dL (ref 0.4–1.2)
Glucose, Bld: 91 mg/dL (ref 70–99)
Potassium: 3.9 mEq/L (ref 3.5–5.1)

## 2011-04-26 LAB — LIPID PANEL
Cholesterol: 162 mg/dL (ref 0–200)
LDL Cholesterol: 105 mg/dL — ABNORMAL HIGH (ref 0–99)
Triglycerides: 52 mg/dL (ref 0.0–149.0)

## 2011-04-26 MED ORDER — SCOPOLAMINE 1 MG/3DAYS TD PT72: 1.0000 | MEDICATED_PATCH | TRANSDERMAL | Status: DC

## 2011-04-26 NOTE — Progress Notes (Signed)
Subjective:    Patient ID: Suzanne Ray, female    DOB: 08/25/1975, 35 y.o.   MRN: [id number]  HPI  35 yo here for CPX.  G1P1, no h/o abnormal pap smears. Last pap smear was 04/18/2010.  IBS symptoms and constipation have improved. Colonoscopy was unremarkable in 05/2010.  Overall feels good.  Patient Active Problem List  Diagnoses   ALLERGIC RHINITIS   IBS   CONSTIPATION   Dyspepsia and other specified disorders of function of stomach   Vaginitis   Screening for STD (sexually transmitted disease)   Anemia   Vitamin D deficiency   Past Medical History  Diagnosis Date   Allergy    IBS (irritable bowel syndrome)    No past surgical history on file. History  Substance Use Topics   Smoking status: Never Smoker    Smokeless tobacco: Not on file   Alcohol Use: No   No family history on file. No Known Allergies Current Outpatient Prescriptions on File Prior to Visit  Medication Sig Dispense Refill   Alum & Mag Hydroxide-Simeth (MAG-AL PLUS XS PO) Take by mouth 2 (two) times daily at 10 AM and 5 PM. MAG/PHOSPHATE THREE TIMES A WEEK        Chlorophyll (O) 100 MG TABS Take one tablet by mouth three times a week        Current Facility-Administered Medications on File Prior to Visit  Medication Dose Route Frequency Provider Last Rate Last Dose   0.9 %  sodium chloride infusion  500 mL Intravenous Continuous Louis Meckel, MD       The PMH, PSH, Social History, Family History, Medications, and allergies have been reviewed in [facility], and have been updated if relevant.    Review of Systems See HPI Patient reports no  vision/ hearing changes,anorexia, weight change, fever ,adenopathy, persistant / recurrent hoarseness, swallowing issues, chest pain, edema,persistant / recurrent cough, hemoptysis, dyspnea(rest, exertional, paroxysmal nocturnal), gastrointestinal  bleeding (melena, rectal bleeding), abdominal pain, excessive heart burn, GU symptoms(dysuria,  hematuria, pyuria, voiding/incontinence  Issues) syncope, focal weakness, severe memory loss, concerning skin lesions, depression, anxiety, abnormal bruising/bleeding, major joint swelling, breast masses or abnormal vaginal bleeding.       Objective:   Physical Exam BP 102/70   Pulse 72   Temp(Src) 98.1 F (36.7 C) (Oral)   Ht 5\' 3"  (1.6 m)   Wt 160 lb 8 oz (72.802 kg)   BMI 28.43 kg/m2   LMP 04/05/2011  General:  Well-developed,well-nourished,in no acute distress; alert,appropriate and cooperative throughout examination Head:  normocephalic and atraumatic.   Eyes:  vision grossly intact, pupils equal, pupils round, and pupils reactive to light.   Ears:  R ear normal and L ear normal.   Nose:  no external deformity.   Mouth:  good dentition.   Neck:  No deformities, masses, or tenderness noted. Lungs:  Normal respiratory effort, chest expands symmetrically. Lungs are clear to auscultation, no crackles or wheezes. Heart:  Normal rate and regular rhythm. S1 and S2 normal without gallop, murmur, click, rub or other extra sounds. Abdomen:  Bowel sounds positive,abdomen soft and non-tender without masses, organomegaly or hernias noted. Msk:  No deformity or scoliosis noted of thoracic or lumbar spine.   Extremities:  No clubbing, cyanosis, edema, or deformity noted with normal full range of motion of all joints.   Neurologic:  alert & oriented X3 and gait normal.   Skin:  Intact without suspicious lesions or rashes Cervical Nodes:  No lymphadenopathy noted Axillary  Nodes:  No palpable lymphadenopathy Psych:  Cognition and judgment appear intact. Alert and cooperative with normal attention span and concentration. No apparent delusions, illusions, hallucinations    Assessment & Plan:   1. Anemia  Stable, recheck CBC today. CBC w/Diff  2. Vitamin D deficiency  Vitamin D 1,25 dihydroxy  3. Routine general medical examination at a health care facility  Reviewed preventive care protocols,  scheduled due services, and updated immunizations Discussed nutrition, exercise, diet, and healthy lifestyle.  Basic Metabolic Panel (BMET) Lipid panel

## 2011-04-26 NOTE — Patient Instructions (Signed)
Good to see you. Have a very [place] Christmas and a happy new year!

## 2011-04-26 NOTE — Progress Notes (Signed)
Addended by: Alvina Chou on: 04/26/2011 09:18 AM   Modules accepted: Orders

## 2011-04-27 ENCOUNTER — Encounter: Payer: Self-pay | Admitting: *Deleted

## 2011-07-12 ENCOUNTER — Ambulatory Visit: Payer: Managed Care, Other (non HMO) | Admitting: Family Medicine

## 2011-07-13 ENCOUNTER — Ambulatory Visit (INDEPENDENT_AMBULATORY_CARE_PROVIDER_SITE_OTHER): Payer: Managed Care, Other (non HMO) | Admitting: Family Medicine

## 2011-07-13 ENCOUNTER — Encounter: Payer: Self-pay | Admitting: Family Medicine

## 2011-07-13 DIAGNOSIS — R109 Unspecified abdominal pain: Secondary | ICD-10-CM

## 2011-07-13 DIAGNOSIS — R1084 Generalized abdominal pain: Secondary | ICD-10-CM | POA: Insufficient documentation

## 2011-07-13 DIAGNOSIS — K3189 Other diseases of stomach and duodenum: Secondary | ICD-10-CM

## 2011-07-13 NOTE — Patient Instructions (Signed)
Good to see you. Please stop by to see Marion on your way out to set up your ultrasound.   

## 2011-07-13 NOTE — Progress Notes (Signed)
Addended by: Gilmer Mor on: 07/13/2011 12:31 PM   Modules accepted: Orders

## 2011-07-13 NOTE — Progress Notes (Signed)
36 here to discuss progressively worsening menstrual cramps x 6 months.  Has noticed for 2 days out of her cycle, she is having severe lower abdominal cramping. Bleeding is not heavier. Getting progressively worse- so bad last month had to take time off of work, was even associated with nausea (no vomiting).  No known family or personal h/o endometriosis. Mom does have fibroid tumors.  No fevers. No dysuria.  Pain resolves spontaneously after 2 or 3 days.   Patient Active Problem List  Diagnoses  . ALLERGIC RHINITIS  . IBS  . CONSTIPATION  . Dyspepsia and other specified disorders of function of stomach  . Vaginitis  . Screening for STD (sexually transmitted disease)  . Anemia  . Vitamin d deficiency   Past Medical History  Diagnosis Date  . Allergy   . IBS (irritable bowel syndrome)    No past surgical history on file. History  Substance Use Topics  . Smoking status: Never Smoker   . Smokeless tobacco: Not on file  . Alcohol Use: No   No family history on file. No Known Allergies Current Outpatient Prescriptions on File Prior to Visit  Medication Sig Dispense Refill  . Alum & Mag Hydroxide-Simeth (MAG-AL PLUS XS PO) Take by mouth 2 (two) times daily at 10 AM and 5 PM. MAG/PHOSPHATE THREE TIMES A WEEK       . Chlorophyll (NULLO) 100 MG TABS Take one tablet by mouth three times a week       . scopolamine (TRANSDERM-SCOP) 1.5 MG Place 1 patch (1.5 mg total) onto the skin every 3 (three) days.  10 patch  1   Current Facility-Administered Medications on File Prior to Visit  Medication Dose Route Frequency Provider Last Rate Last Dose  . 0.9 %  sodium chloride infusion  500 mL Intravenous Continuous Louis Meckel, MD       The PMH, PSH, Social History, Family History, Medications, and allergies have been reviewed in [facility], and have been updated if relevant.  Review of Systems       See HPI   Physical Exam BP 110/80  Pulse 74  Temp(Src) 98.1 F (36.7 C) (Oral)   Wt 162 lb 8 oz (73.71 kg)  General:  Well-developed,well-nourished,in no acute distress; alert,appropriate and cooperative throughout examination Head:  normocephalic and atraumatic.   Eyes:  vision grossly intact, pupils equal, and pupils round.   Ears:  R ear normal and L ear normal.   Nose:  no external deformity.   Mouth:  good dentition.   Neck:  No deformities, masses, or tenderness noted. Lungs:  Normal respiratory effort, chest expands symmetrically. Lungs are clear to auscultation, no crackles or wheezes. Heart:  Normal rate and regular rhythm. S1 and S2 normal without gallop, murmur, click, rub or other extra sounds. Abdomen:  Bowel sounds positive,abdomen soft and non-tender without masses, organomegaly or hernias noted. Msk:  No deformity or scoliosis noted of thoracic or lumbar spine.   Extremities:  no edema Neurologic:  alert & oriented X3 and gait normal.   Skin:  Intact without suspicious lesions or rashes Psych:  Cognition and judgment appear intact. Alert and cooperative with normal attention span and concentration. No apparent delusions, illusions, hallucinations  Assessment and Plan: 1 Abdominal pain  US Transvaginal Non-OB   New- cyclical with her menstrual cycle. Will get vaginal U/S to rule out endometriosis and or fibroid tumors. Discussed OCPs if studies neg but she would prefer not to take them. The patient indicates  understanding of these issues and agrees with the plan.

## 2011-07-14 ENCOUNTER — Encounter (HOSPITAL_COMMUNITY): Payer: Self-pay

## 2011-07-14 ENCOUNTER — Other Ambulatory Visit: Payer: Self-pay | Admitting: Family Medicine

## 2011-07-14 ENCOUNTER — Ambulatory Visit (HOSPITAL_COMMUNITY)
Admission: RE | Admit: 2011-07-14 | Discharge: 2011-07-14 | Disposition: A | Discharge status: Home / Self Care | Payer: Managed Care, Other (non HMO) | Source: Ambulatory Visit | Attending: Family Medicine | Admitting: Family Medicine

## 2011-07-14 DIAGNOSIS — D259 Leiomyoma of uterus, unspecified: Secondary | ICD-10-CM | POA: Insufficient documentation

## 2011-07-14 DIAGNOSIS — N840 Polyp of corpus uteri: Secondary | ICD-10-CM | POA: Insufficient documentation

## 2011-07-14 DIAGNOSIS — N838 Other noninflammatory disorders of ovary, fallopian tube and broad ligament: Secondary | ICD-10-CM | POA: Insufficient documentation

## 2011-07-14 DIAGNOSIS — R109 Unspecified abdominal pain: Secondary | ICD-10-CM | POA: Insufficient documentation

## 2011-07-14 DIAGNOSIS — D252 Subserosal leiomyoma of uterus: Secondary | ICD-10-CM | POA: Insufficient documentation

## 2011-07-14 DIAGNOSIS — N949 Unspecified condition associated with female genital organs and menstrual cycle: Secondary | ICD-10-CM | POA: Insufficient documentation

## 2011-07-20 ENCOUNTER — Encounter: Payer: Self-pay | Admitting: Obstetrics and Gynecology

## 2011-07-20 ENCOUNTER — Ambulatory Visit (INDEPENDENT_AMBULATORY_CARE_PROVIDER_SITE_OTHER): Payer: Managed Care, Other (non HMO) | Admitting: Obstetrics and Gynecology

## 2011-07-20 DIAGNOSIS — D259 Leiomyoma of uterus, unspecified: Secondary | ICD-10-CM

## 2011-07-20 DIAGNOSIS — N946 Dysmenorrhea, unspecified: Secondary | ICD-10-CM

## 2011-07-20 DIAGNOSIS — N840 Polyp of corpus uteri: Secondary | ICD-10-CM

## 2011-07-20 NOTE — Patient Instructions (Signed)
Fibroids You have been diagnosed as having a fibroid. Fibroids are smooth muscle lumps (tumors) which can occur any place in a woman's body. They are usually in the womb (uterus). The most common problem (symptom) of fibroids is bleeding. Over time this may cause low red blood cells (anemia). Other symptoms include feelings of pressure and pain in the pelvis. The diagnosis (learning what is wrong) of fibroids is made by physical exam. Sometimes tests such as an ultrasound are used. This is helpful when fibroids are felt around the ovaries and to look for tumors. TREATMENT   Most fibroids do not need surgical or medical treatment. Sometimes a tissue sample (biopsy) of the lining of the uterus is done to rule out cancer. If there is no cancer and only a small amount of bleeding, the problem can be watched.   Hormonal treatment can improve the problem.   When surgery is needed, it can consist of removing the fibroid. Vaginal birth may not be possible after the removal of fibroids. This depends on where they are and the extent of surgery. When pregnancy occurs with fibroids it is usually normal.   Your caregiver can help decide which treatments are best for you.  HOME CARE INSTRUCTIONS   Do not use aspirin as this may increase bleeding problems.   If your periods (menses) are heavy, record the number of pads or tampons used per month. Bring this information to your caregiver. This can help them determine the best treatment for you.  SEEK IMMEDIATE MEDICAL CARE IF:  You have pelvic pain or cramps not controlled with medications, or experience a sudden increase in pain.   You have an increase of pelvic bleeding between and during menses.   You feel lightheaded or have fainting spells.   You develop worsening belly (abdominal) pain.  Document Released: 05/14/2000 Document Revised: 01/27/2011 Document Reviewed: 01/04/2008 [employer] Patient Information 2012 [employer]. 

## 2011-07-20 NOTE — Progress Notes (Signed)
  Subjective:    Patient ID: Suzanne Ray, female    DOB: 1976-01-04, 37 y.o.   MRN: [id number]  HPI 36 yo G1P1 with LMP 06/29/2010 and BMI 28 presenting today to discuss the findings of pelvic ultrasound ordered to evaluate dysmenorrhea. Patient reports a 6 month h/o cramping pain associated with a normal period. She describes her menses as lasting 5-6 days without excessive bleeding or passage of clots. She was more concerned by her last cycle which caused her to have excessive pain requiring her to stay out of work.   Review of Systems  All other systems reviewed and are negative.       Objective:   Physical Exam  Refused by patient  Ultrasound Two small uterine fibroids are identified, the largest  measuring 1.1 cm.  2. Two focal echogenic areas associated with the endometrium.  Small endometrial polyps cannot be excluded.  3. Ovaries within normal limits.     Assessment & Plan:  36 yo G1P1 with dysmenorrhea and ultrasound demonstrating fibroid uterus and suggestive of endometrial polyp - Results of ultrasound explained to patient - Patient offered D&C hysteroscopy, polypectomy which she refused - Patient would prefer medical management with ibuprofen to help her manage her pain. Patient advised to start ibuprofen 2 days prior to onset of menses. - RTC prn

## 2011-11-15 ENCOUNTER — Encounter: Payer: Self-pay | Admitting: Family Medicine

## 2011-11-15 ENCOUNTER — Ambulatory Visit (INDEPENDENT_AMBULATORY_CARE_PROVIDER_SITE_OTHER): Payer: Managed Care, Other (non HMO) | Admitting: Family Medicine

## 2011-11-15 VITALS — BP 100/70 | HR 68 | Temp 98.6°F | Ht 63.0 in | Wt 164.2 lb

## 2011-11-15 DIAGNOSIS — Z136 Encounter for screening for cardiovascular disorders: Secondary | ICD-10-CM

## 2011-11-15 DIAGNOSIS — Z01818 Encounter for other preprocedural examination: Secondary | ICD-10-CM

## 2011-11-15 LAB — CBC WITH DIFFERENTIAL/PLATELET
Basophils Absolute: 0 10*3/uL (ref 0.0–0.1)
Eosinophils Relative: 1.5 % (ref 0.0–5.0)
HCT: 39 % (ref 36.0–46.0)
Lymphs Abs: 1.3 10*3/uL (ref 0.7–4.0)
Monocytes Relative: 11.1 % (ref 3.0–12.0)
Neutrophils Relative %: 59 % (ref 43.0–77.0)
Platelets: 262 10*3/uL (ref 150.0–400.0)
RDW: 15.5 % — ABNORMAL HIGH (ref 11.5–14.6)
WBC: 4.5 10*3/uL (ref 4.5–10.5)

## 2011-11-15 LAB — POCT URINALYSIS DIPSTICK
Blood, UA: NEGATIVE
Glucose, UA: NEGATIVE
Nitrite, UA: NEGATIVE
Urobilinogen, UA: 0.2

## 2011-11-15 LAB — COMPREHENSIVE METABOLIC PANEL
ALT: 17 U/L (ref 0–35)
Albumin: 4 g/dL (ref 3.5–5.2)
Alkaline Phosphatase: 36 U/L — ABNORMAL LOW (ref 39–117)
CO2: 26 mEq/L (ref 19–32)
GFR: 99 mL/min (ref >60.00)
Glucose, Bld: 88 mg/dL (ref 70–99)
Potassium: 3.8 mEq/L (ref 3.5–5.1)
Sodium: 140 mEq/L (ref 135–145)
Total Bilirubin: 0.4 mg/dL (ref 0.3–1.2)
Total Protein: 7.6 g/dL (ref 6.0–8.3)

## 2011-11-15 LAB — LIPID PANEL
LDL Cholesterol: 103 mg/dL — ABNORMAL HIGH (ref 0–99)
Total CHOL/HDL Ratio: 3
Triglycerides: 77 mg/dL (ref 0.0–149.0)
VLDL: 15.4 mg/dL (ref 0.0–40.0)

## 2011-11-15 LAB — PROTIME-INR: Prothrombin Time: 10.9 s (ref 10.2–12.4)

## 2011-11-15 NOTE — Addendum Note (Signed)
Addended by: Alvina Chou on: 11/15/2011 09:54 AM   Modules accepted: Orders

## 2011-11-15 NOTE — Patient Instructions (Addendum)
Great to see you. We will call you with your lab results and let you know when we fax your letter and results to your surgeon. Good luck!

## 2011-11-15 NOTE — Progress Notes (Signed)
Subjective:    Suzanne Ray is a 36 y.o. female who presents to the office today for a preoperative consultation at the request of surgeon Dr. Karen Kays, MD who plans on performing liposuction (outpt) on July 7.  Planned anesthesia: general. The patient has the following known anesthesia issues: none. Patients bleeding risk: no recent abnormal bleeding. Patient does not have objections to receiving blood products if needed.   Patient Active Problem List  Diagnosis  . ALLERGIC RHINITIS  . IBS  . CONSTIPATION  . Dyspepsia and other specified disorders of function of stomach  . Vaginitis  . Screening for STD (sexually transmitted disease)  . Anemia  . Vitamin d deficiency  . Abdominal pain  . Uterine fibroid  . Uterine polyp  . Dysmenorrhea  . Pre-op evaluation   Past Medical History  Diagnosis Date  . Allergy   . IBS (irritable bowel syndrome)    Past Surgical History  Procedure Date  . Wisdom tooth extraction     x4   History  Substance Use Topics  . Smoking status: Never Smoker   . Smokeless tobacco: Never Used  . Alcohol Use: No   Family History  Problem Relation Age of Onset  . Glaucoma Mother   . Hyperlipidemia Maternal Grandmother   . Hypertension Maternal Grandmother    No Known Allergies Current Outpatient Prescriptions on File Prior to Visit  Medication Sig Dispense Refill  . Alum & Mag Hydroxide-Simeth (MAG-AL PLUS XS PO) Take by mouth 2 (two) times daily at 10 AM and 5 PM. MAG/PHOSPHATE THREE TIMES A WEEK       . Chlorophyll (NULLO) 100 MG TABS Take one tablet by mouth three times a week        Current Facility-Administered Medications on File Prior to Visit  Medication Dose Route Frequency Provider Last Rate Last Dose  . 0.9 %  sodium chloride infusion  500 mL Intravenous Continuous Louis Meckel, MD       The PMH, PSH, Social History, Family History, Medications, and allergies have been reviewed in [facility], and have been updated if  relevant.   Review of Systems  Patient reports no  vision/ hearing changes,anorexia, weight change, fever ,adenopathy, persistant / recurrent hoarseness, swallowing issues, chest pain, edema,persistant / recurrent cough, hemoptysis, dyspnea(rest, exertional, paroxysmal nocturnal), gastrointestinal  bleeding (melena, rectal bleeding), abdominal pain, excessive heart burn, GU symptoms(dysuria, hematuria, pyuria, voiding/incontinence  Issues) syncope, focal weakness, severe memory loss, concerning skin lesions, depression, anxiety, abnormal bruising/bleeding, major joint swelling, breast masses or abnormal vaginal bleeding.       Objective:   BP 100/70  Pulse 68  Temp 98.6 F (37 C) (Oral)  Ht 5\' 3"  (1.6 m)  Wt 164 lb 4 oz (74.503 kg)  BMI 29.10 kg/m2  LMP 10/24/2011   Predictors of intubation difficulty:  Morbid obesity? no  Anatomically abnormal facies? no  Prominent incisors? no  Receding mandible? no  Short, thick neck? no  Neck range of motion: normal  Cardiographics ECG: normal sinus rhythm, no blocks or conduction defects, no ischemic changes (stating abrormal QRS with some flip t waves but these are likely physiological).     Assessment:      36 y.o. female with planned surgery as above.   Known risk factors for perioperative complications: None   Difficulty with intubation is not anticipated.    Plan:    1. Preoperative workup as follows ECG, electrolytes, creatinine, glucose, liver function studies, urinalysis (urinary tract instrumentation planned),  PT/PTT, CBC, HIV, U preg.

## 2011-11-17 ENCOUNTER — Telehealth: Payer: Self-pay

## 2011-11-17 ENCOUNTER — Encounter: Payer: Self-pay | Admitting: Family Medicine

## 2011-11-17 NOTE — Telephone Encounter (Signed)
Pt notified as instructed 11/15/11 labs were within normal limits.

## 2011-11-24 ENCOUNTER — Encounter: Payer: Managed Care, Other (non HMO) | Admitting: Family Medicine

## 2011-12-27 ENCOUNTER — Telehealth (INDEPENDENT_AMBULATORY_CARE_PROVIDER_SITE_OTHER): Payer: Self-pay

## 2011-12-27 ENCOUNTER — Encounter: Payer: Self-pay | Admitting: Family Medicine

## 2011-12-27 ENCOUNTER — Ambulatory Visit (INDEPENDENT_AMBULATORY_CARE_PROVIDER_SITE_OTHER): Payer: Managed Care, Other (non HMO) | Admitting: Family Medicine

## 2011-12-27 VITALS — BP 100/70 | Temp 98.5°F | Wt 169.0 lb

## 2011-12-27 DIAGNOSIS — IMO0002 Reserved for concepts with insufficient information to code with codable children: Secondary | ICD-10-CM

## 2011-12-27 DIAGNOSIS — Z309 Encounter for contraceptive management, unspecified: Secondary | ICD-10-CM

## 2011-12-27 NOTE — Patient Instructions (Addendum)
Great to see you.  You look amazing! Please stop by to see Shirlee Limerick on your way out.  If she's not here yet, she will call you.

## 2011-12-27 NOTE — Progress Notes (Signed)
Subjective:    Patient ID: Suzanne Ray, female    DOB: 1976/04/16, 36 y.o.   MRN: [id number]  HPI  36 yo here for: 1.  Left abdominal seroma- had plastic surgery on 7/9 in [place].  Liposuction and buttocks augmentation.  Had been healing well- drains removed on day 11.  No postoperative complications other than a small seroma drained by surgeon several days after drains removed.  Genessis is unaware of if fluid was cultured but was not told there was any infection.  Has noticed the fluid in that same area, left lower abdominal area is filling back up.  Not painful.  No erythema.  No fevers.  No nausea, vomiting or diarrhea.  2.  Contraceptive management- would like to discuss options- mirena vs implanon.  Does have uterine fibroids and looking for something that could decrease bleeding and cramping.  Patient Active Problem List  Diagnosis   ALLERGIC RHINITIS   IBS   CONSTIPATION   Dyspepsia and other specified disorders of function of stomach   Vaginitis   Screening for STD (sexually transmitted disease)   Anemia   Vitamin d deficiency   Abdominal pain   Uterine fibroid   Uterine polyp   Dysmenorrhea   Pre-op evaluation   Past Medical History  Diagnosis Date   Allergy    IBS (irritable bowel syndrome)    Past Surgical History  Procedure Date   Wisdom tooth extraction     x4   History  Substance Use Topics   Smoking status: Never Smoker    Smokeless tobacco: Never Used   Alcohol Use: No   Family History  Problem Relation Age of Onset   Glaucoma Mother    Hyperlipidemia Maternal Grandmother    Hypertension Maternal Grandmother    No Known Allergies Current Outpatient Prescriptions on File Prior to Visit  Medication Sig Dispense Refill   Alum & Mag Hydroxide-Simeth (MAG-AL PLUS XS PO) Take by mouth 2 (two) times daily at 10 AM and 5 PM. MAG/PHOSPHATE THREE TIMES A WEEK        Chlorophyll (O) 100 MG TABS Take one tablet by mouth three times  a week        Multiple Vitamin (MULTIVITAMIN) tablet Take 1 tablet by mouth daily.       Current Facility-Administered Medications on File Prior to Visit  Medication Dose Route Frequency Provider Last Rate Last Dose   DISCONTD: 0.9 %  sodium chloride infusion  500 mL Intravenous Continuous Louis Meckel, MD       The PMH, PSH, Social History, Family History, Medications, and allergies have been reviewed in [facility], and have been updated if relevant.   Review of Systems See HPI    Objective:   Physical Exam  Constitutional: She appears well-developed and well-nourished. No distress.  HENT:  Head: Normocephalic.  Abdominal: Soft. Bowel sounds are normal. There is no tenderness. There is no rebound.    Psychiatric: She has a normal mood and affect. Her speech is normal and behavior is normal. Judgment and thought content normal.   BP 100/70   Temp 98.5 F (36.9 C)   Wt 169 lb (76.658 kg)        Assessment & Plan:   1. Seroma  New- quite small at this point.  I am not comfortable draining a post operative seroma in office.  No signs of infection. Will refer to local surgeon. The patient indicates understanding of these issues and agrees with the plan.  Ambulatory  referral to General Surgery  2. Contraceptive management  Discussed options- hormonal contraception would likely help with her symptoms.  Advised implanon vs mirena- referred to GYN. Ambulatory referral to Obstetrics / Gynecology

## 2011-12-27 NOTE — Telephone Encounter (Signed)
Shirlee Limerick called to refer the pt to [place] with a postoperative seroma s/p liposuction and buttock augmentation.  The surgery was done in [place].  I discussed this with Dr Derrell Lolling and he says the most appropriate action would be if the pt went back to the surgeon that did the surgery.  The 2nd option is to go to a Engineer, petroleum here in town since the general surgeons don't do this type of surgery.  I notified Shirlee Limerick.

## 2012-03-13 ENCOUNTER — Encounter: Payer: Self-pay | Admitting: Family Medicine

## 2012-03-13 ENCOUNTER — Ambulatory Visit (INDEPENDENT_AMBULATORY_CARE_PROVIDER_SITE_OTHER): Payer: Managed Care, Other (non HMO) | Admitting: Family Medicine

## 2012-03-13 VITALS — BP 100/78 | HR 84 | Temp 98.6°F | Ht 63.75 in | Wt 172.0 lb

## 2012-03-13 DIAGNOSIS — Z0289 Encounter for other administrative examinations: Secondary | ICD-10-CM | POA: Insufficient documentation

## 2012-03-13 DIAGNOSIS — R635 Abnormal weight gain: Secondary | ICD-10-CM

## 2012-03-13 DIAGNOSIS — Z0000 Encounter for general adult medical examination without abnormal findings: Secondary | ICD-10-CM

## 2012-03-13 MED ORDER — PHENTERMINE HCL 15 MG PO TBDP: 1.0000 | ORAL_TABLET | Freq: Every morning | ORAL | Status: DC

## 2012-03-13 NOTE — Progress Notes (Signed)
Subjective:    Patient ID: Suzanne Ray, female    DOB: 27-Jan-1976, 36 y.o.   MRN: [id number]  HPI  36 yo here for CPX.  G1P1, no h/o abnormal pap smears. Last pap smear was normal in 11/2010.  Referred to GYN when I last saw her in July for contraceptive management- IUD vs implanon.  She has a h/o uterine fibroids.  She decided to hold off on this appointment.   Lab Results  Component Value Date   CHOL 171 11/15/2011   HDL 52.70 11/15/2011   LDLCALC 103* 11/15/2011   TRIG 77.0 11/15/2011   CHOLHDL 3 11/15/2011   Weight gain- Feels like her appetite is "out of control."  Tends to snack in afternoon.  Knows that she needs to exercise more, but overall her diet is good and she does exercise regularly.  Wt Readings from Last 3 Encounters:  03/13/12 172 lb (78.019 kg)  12/27/11 169 lb (76.658 kg)  11/15/11 164 lb 4 oz (74.503 kg)     Patient Active Problem List  Diagnosis   ALLERGIC RHINITIS   IBS   CONSTIPATION   Dyspepsia and other specified disorders of function of stomach   Vaginitis   Screening for STD (sexually transmitted disease)   Anemia   Vitamin d deficiency   Abdominal pain   Uterine fibroid   Uterine polyp   Dysmenorrhea   Pre-op evaluation   Routine general medical examination at a health care facility   Past Medical History  Diagnosis Date   Allergy    IBS (irritable bowel syndrome)    Past Surgical History  Procedure Date   Wisdom tooth extraction     x4   History  Substance Use Topics   Smoking status: Never Smoker    Smokeless tobacco: Never Used   Alcohol Use: No   Family History  Problem Relation Age of Onset   Glaucoma Mother    Hyperlipidemia Maternal Grandmother    Hypertension Maternal Grandmother    No Known Allergies Current Outpatient Prescriptions on File Prior to Visit  Medication Sig Dispense Refill   Alum & Mag Hydroxide-Simeth (MAG-AL PLUS XS PO) Take by mouth 2 (two) times daily at 10 AM and 5  PM. MAG/PHOSPHATE THREE TIMES A WEEK        Chlorophyll (O) 100 MG TABS Take one tablet by mouth three times a week        Multiple Vitamin (MULTIVITAMIN) tablet Take 1 tablet by mouth daily.       The PMH, PSH, Social History, Family History, Medications, and allergies have been reviewed in [facility], and have been updated if relevant.    Review of Systems See HPI Patient reports no  vision/ hearing changes,anorexia, weight change, fever ,adenopathy, persistant / recurrent hoarseness, swallowing issues, chest pain, edema,persistant / recurrent cough, hemoptysis, dyspnea(rest, exertional, paroxysmal nocturnal), gastrointestinal  bleeding (melena, rectal bleeding), abdominal pain, excessive heart burn, GU symptoms(dysuria, hematuria, pyuria, voiding/incontinence  Issues) syncope, focal weakness, severe memory loss, concerning skin lesions, depression, anxiety, abnormal bruising/bleeding, major joint swelling, breast masses or abnormal vaginal bleeding.       Objective:   Physical Exam BP 100/78   Pulse 84   Temp 98.6 F (37 C)   Ht 5' 3.75" (1.619 m)   Wt 172 lb (78.019 kg)   BMI 29.76 kg/m2  General:  Well-developed,well-nourished,in no acute distress; alert,appropriate and cooperative throughout examination Head:  normocephalic and atraumatic.   Eyes:  vision grossly intact, pupils equal, pupils round,  and pupils reactive to light.   Ears:  R ear normal and L ear normal.   Nose:  no external deformity.   Mouth:  good dentition.   Neck:  No deformities, masses, or tenderness noted. Lungs:  Normal respiratory effort, chest expands symmetrically. Lungs are clear to auscultation, no crackles or wheezes. Heart:  Normal rate and regular rhythm. S1 and S2 normal without gallop, murmur, click, rub or other extra sounds. Abdomen:  Bowel sounds positive,abdomen soft and non-tender without masses, organomegaly or hernias noted. Msk:  No deformity or scoliosis noted of thoracic or lumbar spine.     Extremities:  No clubbing, cyanosis, edema, or deformity noted with normal full range of motion of all joints.   Neurologic:  alert & oriented X3 and gait normal.   Skin:  Intact without suspicious lesions or rashes Psych:  Cognition and judgment appear intact. Alert and cooperative with normal attention span and concentration. No apparent delusions, illusions, hallucinations    Assessment & Plan:   1. Routine general medical examination at a health care facility  Reviewed preventive care protocols, scheduled due services, and updated immunizations Discussed nutrition, exercise, diet, and healthy lifestyle.   2. Weight gain  New. Discussed risks and benefits of phentermine.  She would like to try it. Will start with phentermine 15 mg daily. Follow up in 1 month.

## 2012-03-13 NOTE — Patient Instructions (Addendum)
Please come see me in one month to follow up.  Please call me if you have any shortness of breath, palpitations, etc. [phone]

## 2012-08-23 IMAGING — US US PELVIS COMPLETE
1 series · 13 of 25 positions shown · non-contrast
Comparison: None.

CLINICAL DATA: Increasing pelvic pain for 6 months.

TRANSABDOMINAL AND TRANSVAGINAL ULTRASOUND OF PELVIS
TECHNIQUE: Both transabdominal and transvaginal ultrasound
examinations of the pelvis were performed. Transabdominal technique
was performed for global imaging of the pelvis including uterus,
ovaries, adnexal regions, and pelvic cul-de-sac.

[Series 1: us pelvis complete · 13 of 81 slices shown]
[im 1/81]
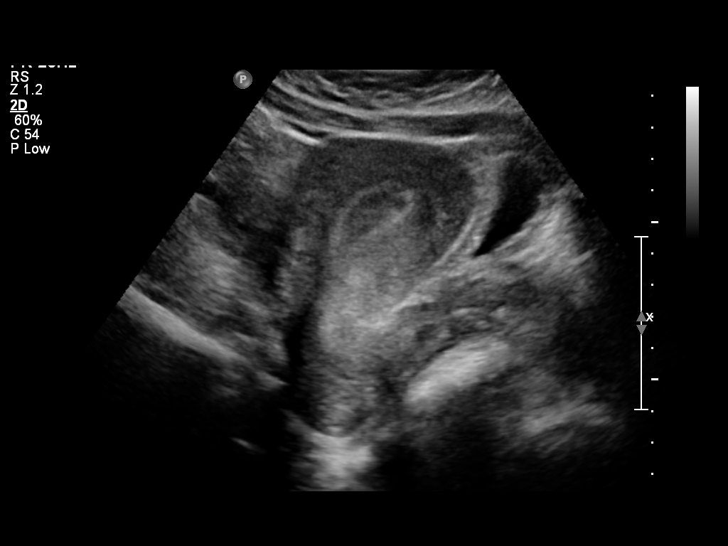
[im 7/81]
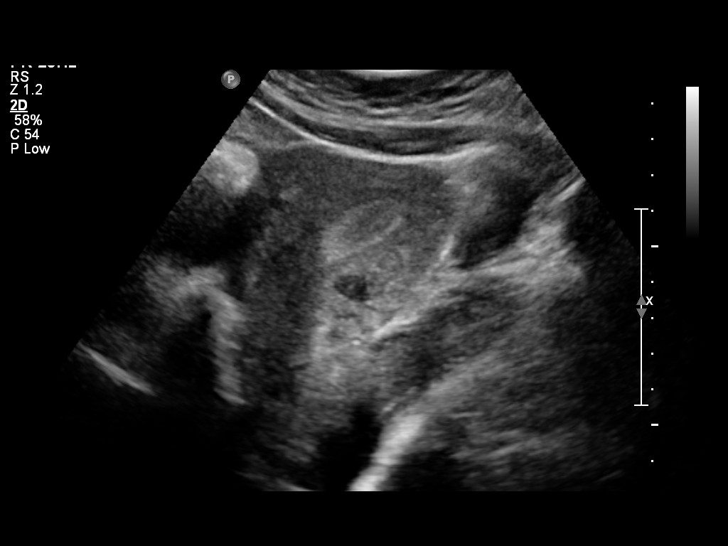
[im 14/81]
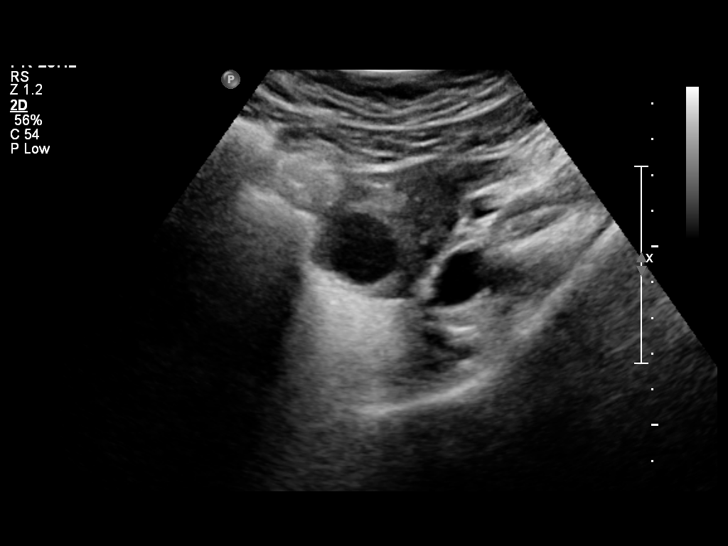
[im 21/81]
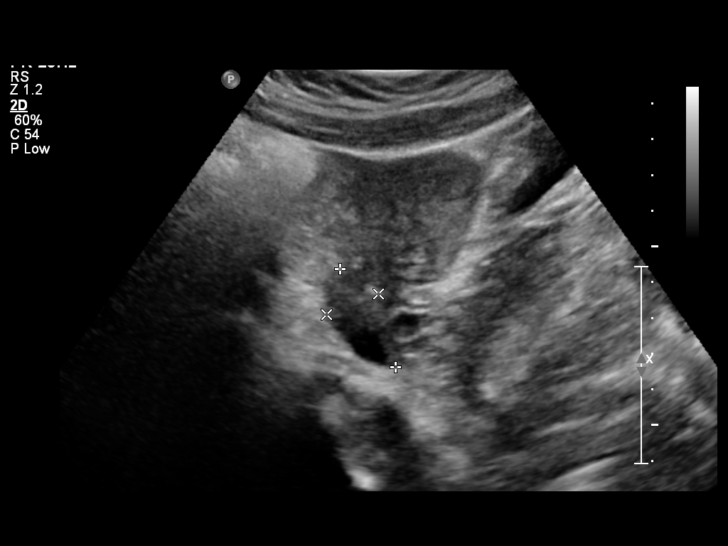
[im 27/81]
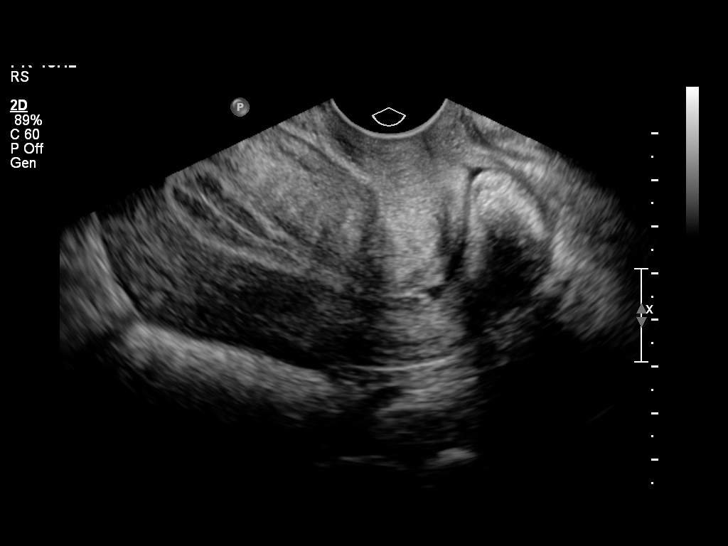
[im 34/81]
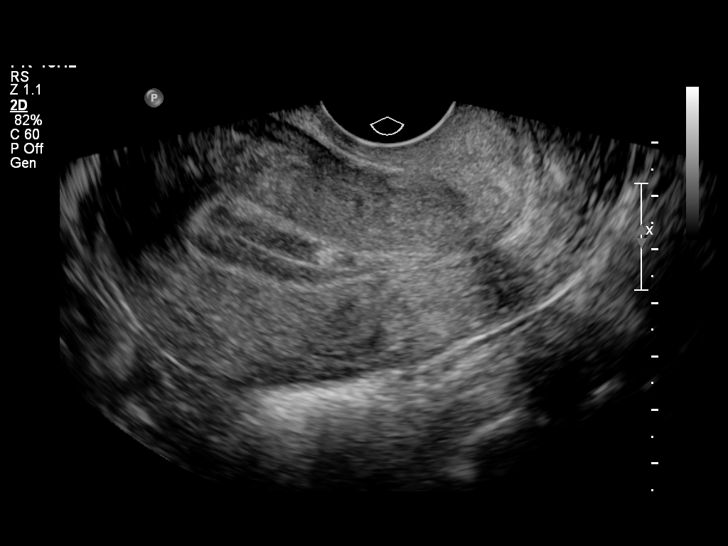
[im 41/81]
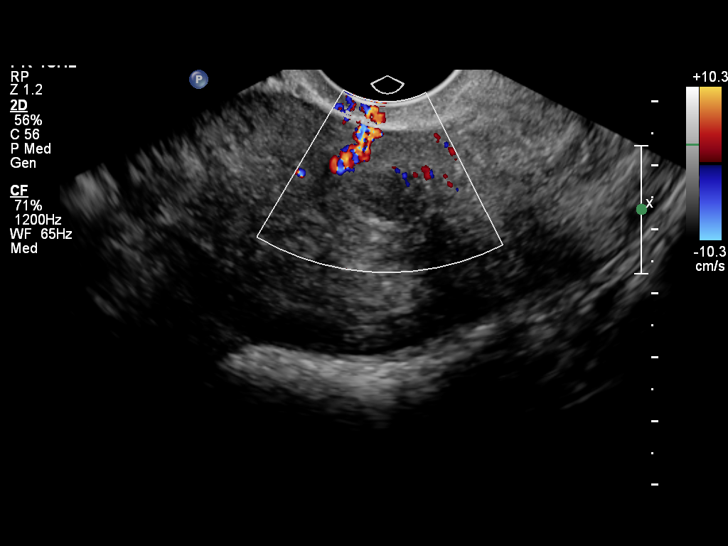
[im 47/81]
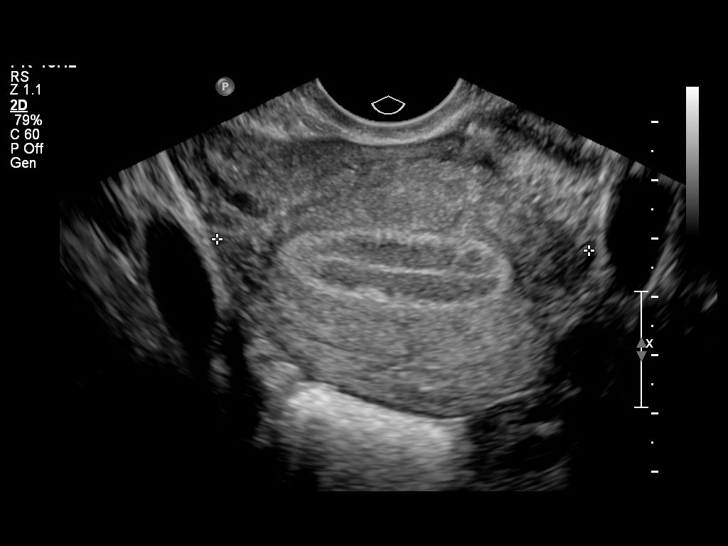
[im 54/81]
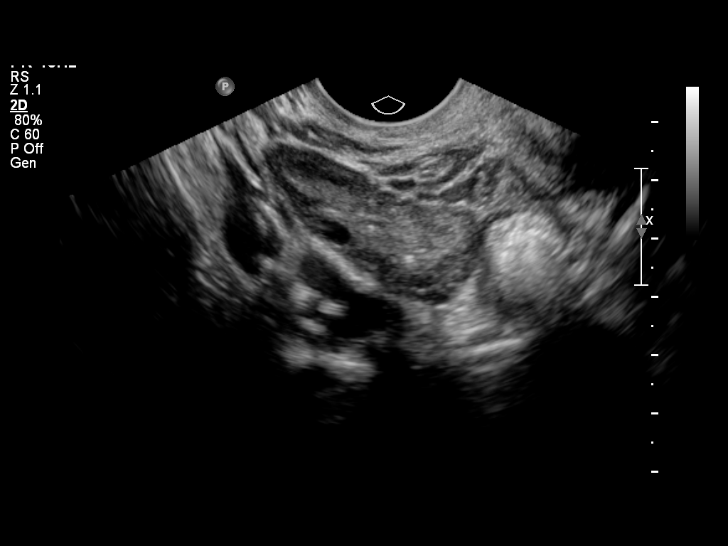
[im 61/81]
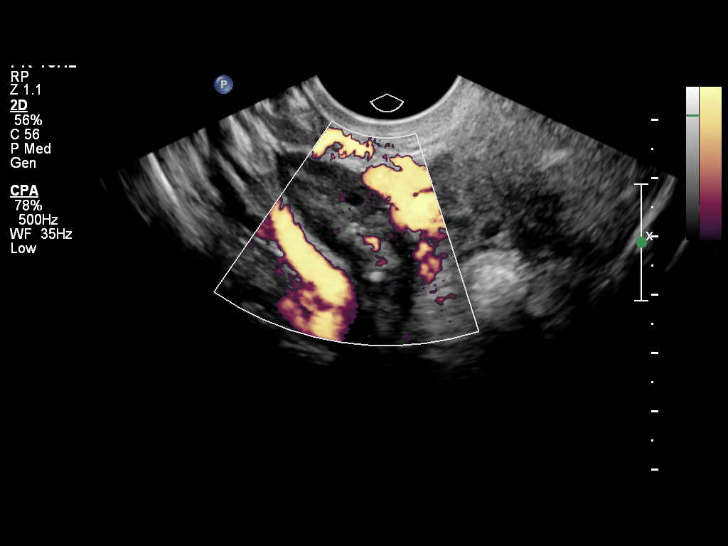
[im 67/81]
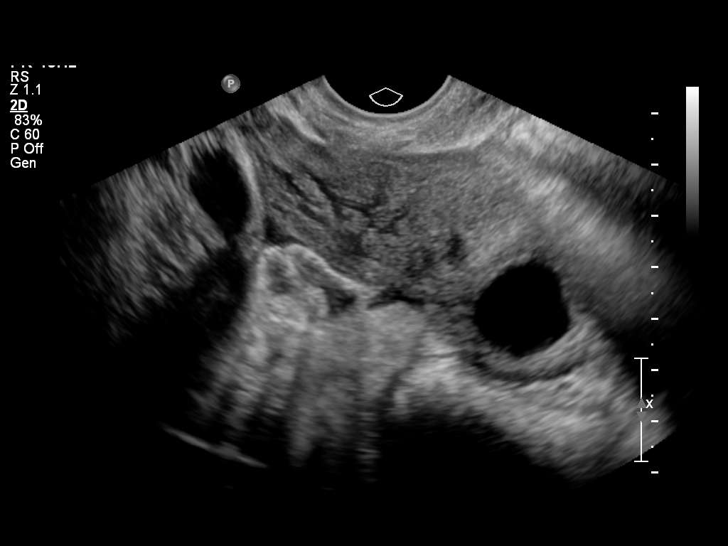
[im 74/81]
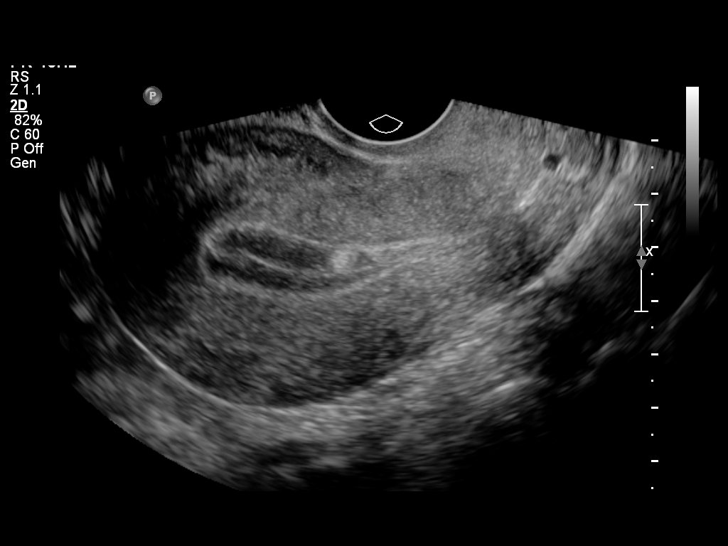
[im 81/81]
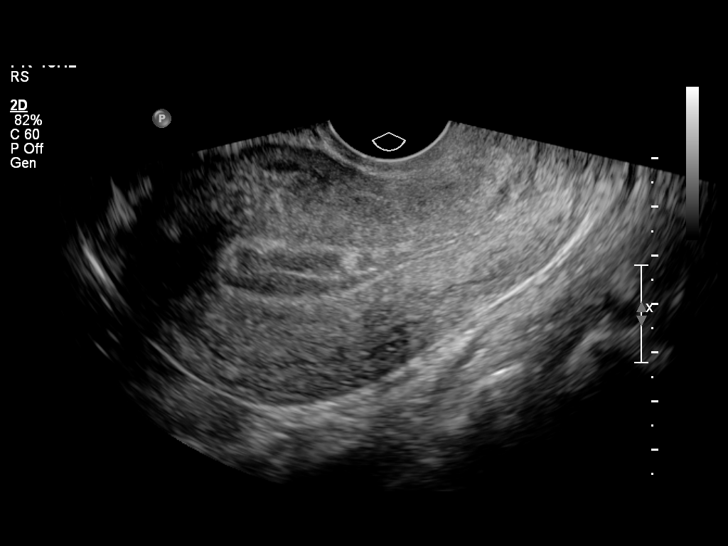

[13 of 25 positions shown; findings below may reference images not displayed]

It was necessary to proceed with endovaginal exam following the
transabdominal exam to visualize the endometrium.
FINDINGS: Uterus: Measures 7.5 x 5.3 x 6.4 cm and is anteverted.  A
subserosal left uterine body fibroid measures 1.1 x 0.7 x 1.1 cm.
An intramural right uterine fibroid  measures 0.8 x 0.6 x 0.1 cm.

Endometrium: Endometrium is trilaminar and measures 14 mm in
maximal thickness at the fundus.  Endometrium is slightly
heterogeneous, with one definite, and probably two, focal echogenic
rounded areas, the largest measuring 0.6 x 0.4 x 0.5 cm in the
anterior endometrium of the lower uterine body.  This area does
contain some internal vascular flow.

Right ovary:  Measures 4.2 x 1.7 x 1.4 cm and contains a few
follicles.  Normal color Doppler flow is seen to the ovarian
parenchyma.  No cyst or mass in the right ovary or right adnexa.

Left ovary: Measures 4.1 x 2.7 x 2.8 cm and contains a dominant
follicle.  No cyst or mass.

Other findings: No free fluid
IMPRESSION: 1.  Two small uterine fibroids are identified, the largest
measuring 1.1 cm.
2.  Two focal echogenic areas associated with the endometrium.
Small endometrial polyps cannot be excluded.
3.  Ovaries within normal limits.

## 2013-02-21 ENCOUNTER — Encounter: Payer: Managed Care, Other (non HMO) | Admitting: Internal Medicine

## 2013-02-27 ENCOUNTER — Encounter: Payer: Self-pay | Admitting: Internal Medicine

## 2013-02-27 ENCOUNTER — Ambulatory Visit (INDEPENDENT_AMBULATORY_CARE_PROVIDER_SITE_OTHER): Payer: Managed Care, Other (non HMO) | Admitting: Internal Medicine

## 2013-02-27 VITALS — BP 100/68 | HR 88 | Temp 98.9°F | Ht 63.75 in | Wt 175.8 lb

## 2013-02-27 DIAGNOSIS — Z13220 Encounter for screening for lipoid disorders: Secondary | ICD-10-CM

## 2013-02-27 DIAGNOSIS — E669 Obesity, unspecified: Secondary | ICD-10-CM

## 2013-02-27 DIAGNOSIS — Z130 Encounter for screening for diseases of the blood and blood-forming organs and certain disorders involving the immune mechanism: Secondary | ICD-10-CM

## 2013-02-27 DIAGNOSIS — K5900 Constipation, unspecified: Secondary | ICD-10-CM

## 2013-02-27 DIAGNOSIS — R109 Unspecified abdominal pain: Secondary | ICD-10-CM

## 2013-02-27 DIAGNOSIS — Z1329 Encounter for screening for other suspected endocrine disorder: Secondary | ICD-10-CM

## 2013-02-27 DIAGNOSIS — Z202 Contact with and (suspected) exposure to infections with a predominantly sexual mode of transmission: Secondary | ICD-10-CM

## 2013-02-27 DIAGNOSIS — N926 Irregular menstruation, unspecified: Secondary | ICD-10-CM

## 2013-02-27 DIAGNOSIS — Z131 Encounter for screening for diabetes mellitus: Secondary | ICD-10-CM

## 2013-02-27 DIAGNOSIS — Z0000 Encounter for general adult medical examination without abnormal findings: Secondary | ICD-10-CM

## 2013-02-27 LAB — COMPREHENSIVE METABOLIC PANEL
Alkaline Phosphatase: 39 U/L (ref 39–117)
BUN: 8 mg/dL (ref 6–23)
CO2: 25 mEq/L (ref 19–32)
Creatinine, Ser: 0.8 mg/dL (ref 0.4–1.2)
GFR: 108.67 mL/min (ref >60.00)
Glucose, Bld: 89 mg/dL (ref 70–99)
Sodium: 137 mEq/L (ref 135–145)
Total Bilirubin: 1 mg/dL (ref 0.3–1.2)
Total Protein: 7.4 g/dL (ref 6.0–8.3)

## 2013-02-27 LAB — CBC
HCT: 37.8 % (ref 36.0–46.0)
Hemoglobin: 12.9 g/dL (ref 12.0–15.0)
MCV: 92.7 fl (ref 78.0–100.0)
RBC: 4.08 Mil/uL (ref 3.87–5.11)
WBC: 4.4 10*3/uL — ABNORMAL LOW (ref 4.5–10.5)

## 2013-02-27 LAB — FOLLICLE STIMULATING HORMONE: FSH: 1.5 m[IU]/mL

## 2013-02-27 LAB — TSH: TSH: 2.5 u[IU]/mL (ref 0.35–5.50)

## 2013-02-27 LAB — HEMOGLOBIN A1C: Hgb A1c MFr Bld: 5.3 % (ref 4.6–6.5)

## 2013-02-27 LAB — LUTEINIZING HORMONE: LH: 0.99 m[IU]/mL

## 2013-02-27 NOTE — Addendum Note (Signed)
Addended by: Alvina Chou on: 02/27/2013 09:32 AM   Modules accepted: Orders

## 2013-02-27 NOTE — Addendum Note (Signed)
Addended by: Alvina Chou on: 02/27/2013 09:27 AM   Modules accepted: Orders

## 2013-02-27 NOTE — Assessment & Plan Note (Signed)
Will check TSH Likely due to poor diet and lack of exercise Incorporate more fresh fruits and vegetables into your diet Statr to exercise for at least 30 minutes 4 days out of the week

## 2013-02-27 NOTE — Patient Instructions (Signed)

## 2013-02-27 NOTE — Progress Notes (Signed)
HPI  Pt presents to the clinic today for her annual physical. She does have a few concerns today. 1- She still has abdominal cramping and constipation. She has been evaluated by GI for this . Upper GI and Colonoscopy were negative. She was diagnosed with IBS-C. She is concerned that she may be allergic to gluten or have celiac disease. She has no family history of celiac disease. She has not tried a gluten free diet to see if that helps. She does take Magnesium and Chlorophyll daily. 2- She is still concerned about her weight gain. She has gained 3 lbs over the last year. Her diet is good but she does not exercises. She tried Phentermine for 3 days this time last year but was unable to tolerate it due to jitteriness. She has also felt fatigued, colder than normal and had some menstrual irregularities. She is not sure if she is perimenopausal or she has a thyroid issue. She would like both of these checked out.   Flu: never Tetanus: 2008 LMP: 01/28/2013 Pap Smear: 03/2012 per pt report (but last I saw in epic was 2012) Pt reports she is going to see a gyn soon- already has the appt made Eye Doctor: yearly- Dr. Shirlyn Goltz Dentist: yearly- [facility]  Past Medical History  Diagnosis Date   Allergy    IBS (irritable bowel syndrome)     Current Outpatient Prescriptions  Medication Sig Dispense Refill   Alum & Mag Hydroxide-Simeth (MAG-AL PLUS XS PO) Take by mouth 2 (two) times daily at 10 AM and 5 PM. MAG/PHOSPHATE THREE TIMES A WEEK        Chlorophyll (O) 100 MG TABS Take one tablet by mouth three times a week        No current facility-administered medications for this visit.    No Known Allergies  Family History  Problem Relation Age of Onset   Glaucoma Mother    Hyperlipidemia Maternal Grandmother    Hypertension Maternal Grandmother     History   Social History   Marital Status: Single    Spouse Name: N/A    Number of Children: 1   Years of Education:  N/A   Occupational History    [employer]   Social History Main Topics   Smoking status: Never Smoker    Smokeless tobacco: Never Used   Alcohol Use: No   Drug Use: No   Sexual Activity: No   Other Topics Concern   Not on file   Social History Narrative   One son, Jayden          ROS:  Constitutional: Pt reports fatigue and weight gain. Denies fever, malaise, headache.  HEENT: Denies eye pain, eye redness, ear pain, ringing in the ears, wax buildup, runny nose, nasal congestion, bloody nose, or sore throat. Respiratory: Denies difficulty breathing, shortness of breath, cough or sputum production.   Cardiovascular: Denies chest pain, chest tightness, palpitations or swelling in the hands or feet.  Gastrointestinal: Pt reports abdominal cramping and constipation. Denies bloating, diarrhea or blood in the stool.  GU: Pt reports spotting for 4 days prior to onset of period. Denies frequency, urgency, pain with urination, blood in urine, odor or discharge. Musculoskeletal: Denies decrease in range of motion, difficulty with gait, muscle pain or joint pain and swelling.  Skin: Denies redness, rashes, lesions or ulcercations.  Neurological: Denies dizziness, difficulty with memory, difficulty with speech or problems with balance and coordination.   No other specific complaints in a  complete review of systems (except as listed in HPI above).  PE:  BP 100/68   Pulse 88   Temp(Src) 98.9 F (37.2 C) (Oral)   Ht 5' 3.75" (1.619 m)   Wt 175 lb 12.8 oz (79.742 kg)   BMI 30.42 kg/m2   SpO2 98% Wt Readings from Last 3 Encounters:  02/27/13 175 lb 12.8 oz (79.742 kg)  03/13/12 172 lb (78.019 kg)  12/27/11 169 lb (76.658 kg)    General: Appears her stated age, obese but well developed, well nourished in NAD. HEENT: Head: normal shape and size; Eyes: sclera white, no icterus, conjunctiva pink, PERRLA and EOMs intact; Ears: Tm's gray and intact, normal light reflex; Nose: mucosa pink and  moist, septum midline; Throat/Mouth: Teeth present, mucosa pink and moist, no lesions or ulcerations noted.  Neck: Normal range of motion. Neck supple, trachea midline. No massses, lumps or thyromegaly present.  Cardiovascular: Normal rate and rhythm. S1,S2 noted.  No murmur, rubs or gallops noted. No JVD or BLE edema. No carotid bruits noted. Pulmonary/Chest: Normal effort and positive vesicular breath sounds. No respiratory distress. No wheezes, rales or ronchi noted.  Abdomen: Soft and nontender. Normal bowel sounds, no bruits noted. No distention or masses noted. Liver, spleen and kidneys non palpable. Musculoskeletal: Normal range of motion. No signs of joint swelling. No difficulty with gait.  Neurological: Alert and oriented. Cranial nerves II-XII intact. Coordination normal. +DTRs bilaterally. Psychiatric: Mood and affect normal. Behavior is normal. Judgment and thought content normal.    BMET    Component Value Date/Time   NA 140 11/15/2011 0853   K 3.8 11/15/2011 0853   CL 109 11/15/2011 0853   CO2 26 11/15/2011 0853   GLUCOSE 88 11/15/2011 0853   BUN 13 11/15/2011 0853   CREATININE 0.8 11/15/2011 0853   CALCIUM 8.9 11/15/2011 0853    Lipid Panel     Component Value Date/Time   CHOL 171 11/15/2011 0853   TRIG 77.0 11/15/2011 0853   HDL 52.70 11/15/2011 0853   CHOLHDL 3 11/15/2011 0853   VLDL 15.4 11/15/2011 0853   LDLCALC 103* 11/15/2011 0853    CBC    Component Value Date/Time   WBC 4.5 11/15/2011 0853   RBC 4.20 11/15/2011 0853   HGB 13.2 11/15/2011 0853   HCT 39.0 11/15/2011 0853   PLT 262.0 11/15/2011 0853   MCV 92.9 11/15/2011 0853   MCHC 33.8 11/15/2011 0853   RDW 15.5* 11/15/2011 0853   LYMPHSABS 1.3 11/15/2011 0853   MONOABS 0.5 11/15/2011 0853   EOSABS 0.1 11/15/2011 0853   BASOSABS 0.0 11/15/2011 0853    Hgb A1C No results found for this basename: HGBA1C     Assessment and Plan:  Preventative Health Maintenance:  Pt declined flu shot today Will have pap smear  done this year Will obtain screening labwork  Abdominal cramping and Constipation, with diagnosis of IBS-C:  Will check celiac panel per pt request  Menstrual irregularity, new onset:  Will check FSH/LH per pt request Please be sure to follow up with your gyn regarding this issue   RTC in 1 year or sooner if needed

## 2013-02-28 LAB — RPR

## 2013-02-28 LAB — GLIA (IGA/G) + TTG IGA: Gliadin IgA: 4.1 U/mL (ref <20)

## 2013-02-28 LAB — HIV ANTIBODY (ROUTINE TESTING W REFLEX): HIV: NONREACTIVE

## 2013-03-08 ENCOUNTER — Telehealth: Payer: Self-pay | Admitting: Internal Medicine

## 2013-03-08 NOTE — Telephone Encounter (Signed)
Pt was very upset because she was charged an OV with her CPE.  She said she did not bring up anything outside of wellness because she knew the guidelines of a CPE.  She said she was asked if she still sees her GI and was asked if she had a pap smear.  She would like the office visit written off. Rene Kocher, what are your thoughts on this?

## 2013-03-08 NOTE — Telephone Encounter (Signed)
We talked about 3 other problems not included in wellnes Her IBS-C- ordered a celiac panel Menstrual irregularities- ordered hormone labs And counseling on weight loss goals  I am not going to change the billing

## 2013-03-08 NOTE — Telephone Encounter (Signed)
Spoke with pt advised of Regina's message. 

## 2013-08-13 ENCOUNTER — Telehealth: Payer: Self-pay | Admitting: Family Medicine

## 2013-08-13 DIAGNOSIS — R1013 Epigastric pain: Principal | ICD-10-CM

## 2013-08-13 DIAGNOSIS — K319 Disease of stomach and duodenum, unspecified: Secondary | ICD-10-CM

## 2013-08-13 NOTE — Telephone Encounter (Signed)
Pt is needing a referral to [facility] for a 2nd opinion. Can you place the referral or will patient need to come in to be seen? Thank you.

## 2013-08-14 NOTE — Telephone Encounter (Signed)
Spoke to pt and informed her referral has been placed and to await a call from [place] with appt details

## 2013-08-14 NOTE — Telephone Encounter (Signed)
I placed referral.

## 2013-09-10 ENCOUNTER — Ambulatory Visit: Payer: Managed Care, Other (non HMO) | Admitting: Family Medicine

## 2014-02-14 ENCOUNTER — Encounter: Payer: Self-pay | Admitting: Gastroenterology

## 2014-04-01 ENCOUNTER — Encounter: Payer: Self-pay | Admitting: Internal Medicine

## 2014-09-20 ENCOUNTER — Telehealth: Payer: Self-pay | Admitting: Family Medicine

## 2014-09-20 NOTE — Telephone Encounter (Signed)
Spoke to pt regarding 2014 labs, as she saw that some of the values were abnormal. Advised pt results from 2years ago, would not reflect her current status. Pt states she had recent repeat labs at [facility], and I advised her to contact them to discuss lab results and f/us

## 2014-09-20 NOTE — Telephone Encounter (Signed)
Pt called stating she would like to speak to a nurse  concerning lab results  For 02/2013

## 2014-09-23 ENCOUNTER — Ambulatory Visit: Payer: Managed Care, Other (non HMO) | Admitting: Family Medicine

## 2014-11-25 ENCOUNTER — Ambulatory Visit (INDEPENDENT_AMBULATORY_CARE_PROVIDER_SITE_OTHER): Payer: Managed Care, Other (non HMO) | Admitting: Family Medicine

## 2014-11-25 ENCOUNTER — Encounter: Payer: Self-pay | Admitting: Family Medicine

## 2014-11-25 VITALS — BP 124/78 | HR 83 | Temp 97.8°F | Wt 173.0 lb

## 2014-11-25 DIAGNOSIS — E0500 Thyrotoxicosis with diffuse goiter without thyrotoxic crisis or storm: Secondary | ICD-10-CM | POA: Insufficient documentation

## 2014-11-25 NOTE — Assessment & Plan Note (Signed)
New- >25 minutes spent in face to face time with patient, >50% spent in counselling or coordination of care I have requested records from opthalmology.   After our discussion, we agreed that endocrinology referral would be appropriate- referral placed.  She would like to go to [facility].

## 2014-11-25 NOTE — Patient Instructions (Signed)
Good to see you. Please stop by to see Suzanne Ray on your way out. 

## 2014-11-25 NOTE — Progress Notes (Signed)
Pre visit review using our clinic review tool, if applicable. No additional management support is needed unless otherwise documented below in the visit note. 

## 2014-11-25 NOTE — Progress Notes (Signed)
Subjective:   Patient ID: Suzanne Ray, female    DOB: 11-10-1975, 39 y.o.   MRN: [id number]  Kaede Clendenen is a pleasant 39 y.o. 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  Noticed pressure in her right eye and that it looked larger than the other.  Went to one optometrist who told she had dry eye.  Then went to Dr. Rosendo Gros at [facility] who told her she had Grave's.  We have requested these records.  Per pt, labs were normal and he told he she did not need to see endocrinologist.  She would like to know my opinion about this.  She is very tearful because she is worried something is very seriously wrong with her.  No eye pain or blurred vision.  No photophobia. Denies palpitations or other symptoms of hyperthyroidism. Current Outpatient Prescriptions on File Prior to Visit  Medication Sig Dispense Refill   Alum & Mag Hydroxide-Simeth (MAG-AL PLUS XS PO) Take by mouth 2 (two) times daily at 10 AM and 5 PM. MAG/PHOSPHATE THREE TIMES A WEEK      Chlorophyll (O) 100 MG TABS Take one tablet by mouth three times a week      No current facility-administered medications on file prior to visit.    No Known Allergies  Past Medical History  Diagnosis Date   Allergy    IBS (irritable bowel syndrome)     Past Surgical History  Procedure Laterality Date   Wisdom tooth extraction      x4    Family History  Problem Relation Age of Onset   Glaucoma Mother    Hyperlipidemia Maternal Grandmother    Hypertension Maternal Grandmother     History   Social History   Marital Status: Single    Spouse Name: N/A   Number of Children: 1   Years of Education: N/A   Occupational History    Theme park manager   Social History Main Topics   Smoking status: Never Smoker    Smokeless tobacco: Never Used   Alcohol Use: No   Drug Use: No   Sexual Activity: No   Other  Topics Concern   Not on file   Social History Narrative   One son, Ralls         The PMH, PSH, Social History, Family History, Medications, and allergies have been reviewed in [facility], and have been updated if relevant.   Review of Systems  Constitutional: Negative for fatigue and unexpected weight change.  Eyes: Negative for photophobia and visual disturbance.  Cardiovascular: Negative.   Endocrine: Positive for cold intolerance. Negative for heat intolerance.  Genitourinary: Positive for difficulty urinating.  Musculoskeletal: Negative.   Allergic/Immunologic: Negative.   Neurological: Negative.   Hematological: Negative.   Psychiatric/Behavioral: Negative.   All other systems reviewed and are negative.      Objective:    BP 124/78 mmHg   Pulse 83   Temp(Src) 97.8 F (36.6 C) (Oral)   Wt 173 lb (78.472 kg)   SpO2 98%   LMP 10/29/2014 Wt Readings from Last 3 Encounters:  11/25/14 173 lb (78.472 kg)  02/27/13 175 lb 12.8 oz (79.742 kg)  03/13/12 172 lb (78.019 kg)     Physical Exam  Constitutional: She is oriented to person, place, and time. She appears well-developed and well-nourished. No distress.  HENT:  Head: Normocephalic.  Eyes: Conjunctivae are normal.  Right eye does appear larger than left eye.  She is able to close both eyes completely and resist me opening them.  Neck: Neck supple. No thyromegaly present.  Cardiovascular: Normal rate.   Pulmonary/Chest: Effort normal.  Neurological: She is alert and oriented to person, place, and time. No cranial nerve deficit.  Skin: Skin is warm and dry.  Psychiatric: She has a normal mood and affect. Her behavior is normal. Judgment and thought content normal.  Nursing note and vitals reviewed.         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

## 2015-07-15 HISTORY — PX: ROBOT ASSISTED MYOMECTOMY: SHX5142

## 2015-08-08 ENCOUNTER — Ambulatory Visit: Payer: Managed Care, Other (non HMO) | Admitting: Family Medicine

## 2015-11-13 ENCOUNTER — Ambulatory Visit (INDEPENDENT_AMBULATORY_CARE_PROVIDER_SITE_OTHER): Payer: Managed Care, Other (non HMO) | Admitting: Family Medicine

## 2015-11-13 ENCOUNTER — Encounter: Payer: Self-pay | Admitting: Family Medicine

## 2015-11-13 ENCOUNTER — Telehealth: Payer: Self-pay | Admitting: Family Medicine

## 2015-11-13 VITALS — BP 126/74 | HR 72 | Temp 98.6°F | Ht 64.0 in | Wt 181.2 lb

## 2015-11-13 DIAGNOSIS — E669 Obesity, unspecified: Secondary | ICD-10-CM | POA: Diagnosis not present

## 2015-11-13 MED ORDER — LORCASERIN HCL 10 MG PO TABS: 1.0000 | ORAL_TABLET | Freq: Two times a day (BID) | ORAL | Status: DC

## 2015-11-13 NOTE — Telephone Encounter (Signed)
Pt called stating she was prescribed Belviq. She cannot get this filled without having a prior authorization done.  She wanted to be sure we got the info for this process from [place].  Call patient with questions.

## 2015-11-13 NOTE — Progress Notes (Signed)
   Subjective:   Patient ID: Suzanne Ray, female    DOB: 1975/09/21, 40 y.o.   MRN: [id number]  Suzanne Ray is a pleasant 40 y.o. year old female who presents to clinic today with Weight Gain  on 11/13/2015  HPI:  Obesity- BMI now 31.10 Tried phentermine in past, was not effective. She is now exercising and cutting back on portions. Asking for an alternative to phentermine.  Wt Readings from Last 3 Encounters:  11/13/15 181 lb 4 oz (82.214 kg)  11/25/14 173 lb (78.472 kg)  02/27/13 175 lb 12.8 oz (79.742 kg)   No current outpatient prescriptions on file prior to visit.   No current facility-administered medications on file prior to visit.    No Known Allergies  Past Medical History  Diagnosis Date  . Allergy   . IBS (irritable bowel syndrome)     Past Surgical History  Procedure Laterality Date  . Wisdom tooth extraction      x4  . Robot assisted myomectomy  07/15/2015    Family History  Problem Relation Age of Onset  . Glaucoma Mother   . Hyperlipidemia Maternal Grandmother   . Hypertension Maternal Grandmother     Social History   Social History  . Marital Status: Single    Spouse Name: N/A  . Number of Children: 1  . Years of Education: N/A   Occupational History  .  [employer]   Social History Main Topics  . Smoking status: Never Smoker   . Smokeless tobacco: Never Used  . Alcohol Use: No  . Drug Use: No  . Sexual Activity: No   Other Topics Concern  . Not on file   Social History Narrative   One son, Dorris Fetch         The PMH, PSH, Social History, Family History, Medications, and allergies have been reviewed in [facility], and have been updated if relevant.   Review of Systems  Constitutional: Negative.   Respiratory: Negative.   Cardiovascular: Negative.   Gastrointestinal: Negative.   Genitourinary: Negative.   Musculoskeletal: Negative.   Skin: Negative.   Allergic/Immunologic: Negative.   Neurological: Negative.     Hematological: Negative.   Psychiatric/Behavioral: Negative.   All other systems reviewed and are negative.      Objective:    BP 126/74 mmHg  Pulse 72  Temp(Src) 98.6 F (37 C) (Oral)  Ht 5\' 4"  (1.626 m)  Wt 181 lb 4 oz (82.214 kg)  BMI 31.10 kg/m2  SpO2 99%  LMP 10/17/2015   Physical Exam  Constitutional: She is oriented to person, place, and time. She appears well-developed and well-nourished. No distress.  HENT:  Head: Normocephalic.  Eyes: Conjunctivae are normal.  Cardiovascular: Normal rate.   Pulmonary/Chest: Effort normal.  Musculoskeletal: Normal range of motion.  Neurological: She is alert and oriented to person, place, and time. No cranial nerve deficit.  Skin: Skin is warm and dry. She is not diaphoretic.  Psychiatric: She has a normal mood and affect. Her behavior is normal. Judgment and thought content normal.  Nursing note and vitals reviewed.         Assessment & Plan:   Obesity No Follow-up on file.

## 2015-11-13 NOTE — Progress Notes (Signed)
Pre visit review using our clinic review tool, if applicable. No additional management support is needed unless otherwise documented below in the visit note. 

## 2015-11-13 NOTE — Assessment & Plan Note (Signed)
Deteriorated. >25 minutes spent in face to face time with patient, >50% spent in counselling or coordination of care Discussed rx options. Rx given to pt for Belviq 10 mg twice daily. If no response in 12 weeks, will d/c. Follow up in with me in 12 weeks.

## 2015-11-13 NOTE — Patient Instructions (Signed)
Great to see you. We are starting Belviq 10 mg twice daily.  Please follow up in 12 weeks.

## 2015-11-14 NOTE — Telephone Encounter (Signed)
Fax received indicating approval for 12wks only; valid 6/16 - 02/06/2016. Copy faxed to pharmacy and sent to be scanned.

## 2015-11-14 NOTE — Telephone Encounter (Signed)
Spoke to pts insurance and completed PA. Fax to be received with response.

## 2015-11-14 NOTE — Telephone Encounter (Signed)
Pt returned your call best number [phone] The med is covered per pt insurance at 100%  But she still needs prior autho Direct number for pre cert [id number] Please advise pt when this has been done

## 2015-12-29 ENCOUNTER — Telehealth: Payer: Self-pay

## 2015-12-29 NOTE — Telephone Encounter (Signed)
Pt only took 4 days of Belviq because Belviq caused H/A, fatigue, back pain and constipation. Pt wants to know if can go back on phentermine. Pt request cb. [facility] [address]. Pt last seen 11/13/15.

## 2015-12-29 NOTE — Telephone Encounter (Signed)
Yes, which dose of phentermine would she like to restart?

## 2015-12-29 NOTE — Telephone Encounter (Signed)
No she would need an OV for further refills but we have a recent BP and she has been on it before and tolerated it well. BP Readings from Last 3 Encounters:  11/13/15 126/74  11/25/14 124/78  02/27/13 100/68

## 2015-12-29 NOTE — Telephone Encounter (Signed)
Is she needing an OV? She has not been on phentermine 15mg  since 2014

## 2015-12-30 NOTE — Telephone Encounter (Signed)
Pt called and would like medication called in to [facility] on [address].  Best number to call [phone]

## 2015-12-31 ENCOUNTER — Encounter: Payer: Self-pay | Admitting: Family Medicine

## 2015-12-31 NOTE — Telephone Encounter (Signed)
Pt left message at front desk requesting cb about phentermine rx. And pt request cb ASAP.

## 2015-12-31 NOTE — Telephone Encounter (Signed)
This medication was not added to pts med list. Approval must come from Dr Deborra Medina, as it will have to include dosing and instruction because pt has not been on medication since 2014 and it is not on her current med list

## 2015-12-31 NOTE — Telephone Encounter (Signed)
Ok to call in rx as entered below.

## 2016-01-01 MED ORDER — PHENTERMINE HCL 30 MG PO CAPS: 30.0000 mg | ORAL_CAPSULE | ORAL | 0 refills | Status: DC

## 2016-01-01 NOTE — Telephone Encounter (Signed)
Pt contact office and stated PA required (see mychart message) spoke to [place] and confirmed price of <$10. Spoke to pt and advised. Rx called in to new pharmacy

## 2016-01-01 NOTE — Telephone Encounter (Signed)
Rx called in to requested pharmacy 

## 2016-01-01 NOTE — Telephone Encounter (Signed)
Spoke to pt and informed her Rx has been called in to the requested pharmacy

## 2016-01-02 ENCOUNTER — Telehealth: Payer: Self-pay | Admitting: *Deleted

## 2016-01-02 NOTE — Telephone Encounter (Signed)
PA completed via covermymeds. Awaiting clinical questions by fax per online

## 2016-01-26 NOTE — Telephone Encounter (Signed)
Patient called and said she has been waiting for prior authorization for Phentermine.  Patient paid out of pocket for her last prescription and she'll run out in a week and a half and she doesn't want to have to pay for the next refill.  Patient said a prior authorization was done on the online portal.  Holland Falling said it was incomplete.  They won't continue the prior authorization process until the form is completed. Patient spoke to [facility] this morning.  Please complete online or fax form to [place].  Patient left fax number in an earlier message.  Patient can be reached at [phone].

## 2016-01-30 NOTE — Telephone Encounter (Signed)
Fax received indicating approval through 01/29/2017. Copy faxed to pharmacy and sent to be scanned

## 2016-02-04 ENCOUNTER — Encounter (HOSPITAL_COMMUNITY): Payer: Self-pay | Admitting: *Deleted

## 2016-02-04 ENCOUNTER — Emergency Department (HOSPITAL_COMMUNITY): Payer: Managed Care, Other (non HMO)

## 2016-02-04 ENCOUNTER — Emergency Department (HOSPITAL_COMMUNITY)
Admission: EM | Admit: 2016-02-04 | Discharge: 2016-02-04 | Disposition: A | Discharge status: Home / Self Care | Payer: Managed Care, Other (non HMO) | Attending: Emergency Medicine | Admitting: Emergency Medicine

## 2016-02-04 DIAGNOSIS — R1011 Right upper quadrant pain: Secondary | ICD-10-CM

## 2016-02-04 DIAGNOSIS — R1084 Generalized abdominal pain: Secondary | ICD-10-CM

## 2016-02-04 DIAGNOSIS — R509 Fever, unspecified: Secondary | ICD-10-CM

## 2016-02-04 DIAGNOSIS — R112 Nausea with vomiting, unspecified: Secondary | ICD-10-CM | POA: Diagnosis not present

## 2016-02-04 DIAGNOSIS — R1013 Epigastric pain: Secondary | ICD-10-CM | POA: Diagnosis present

## 2016-02-04 DIAGNOSIS — R945 Abnormal results of liver function studies: Secondary | ICD-10-CM | POA: Insufficient documentation

## 2016-02-04 DIAGNOSIS — Z79899 Other long term (current) drug therapy: Secondary | ICD-10-CM | POA: Insufficient documentation

## 2016-02-04 DIAGNOSIS — R7989 Other specified abnormal findings of blood chemistry: Secondary | ICD-10-CM

## 2016-02-04 LAB — CBC
HCT: 37.4 % (ref 36.0–46.0)
Hemoglobin: 12.1 g/dL (ref 12.0–15.0)
MCH: 28.8 pg (ref 26.0–34.0)
MCHC: 32.4 g/dL (ref 30.0–36.0)
MCV: 89 fL (ref 78.0–100.0)
PLATELETS: 262 10*3/uL (ref 150–400)
RBC: 4.2 MIL/uL (ref 3.87–5.11)
RDW: 13.5 % (ref 11.5–15.5)
WBC: 6.5 10*3/uL (ref 4.0–10.5)

## 2016-02-04 LAB — COMPREHENSIVE METABOLIC PANEL
ALK PHOS: 62 U/L (ref 38–126)
ALT: 112 U/L — AB (ref 14–54)
AST: 306 U/L — AB (ref 15–41)
Albumin: 3.9 g/dL (ref 3.5–5.0)
Anion gap: 15 (ref 5–15)
BILIRUBIN TOTAL: 0.7 mg/dL (ref 0.3–1.2)
BUN: 9 mg/dL (ref 6–20)
CALCIUM: 8.9 mg/dL (ref 8.9–10.3)
CO2: 23 mmol/L (ref 22–32)
CREATININE: 1.02 mg/dL — AB (ref 0.44–1.00)
Chloride: 99 mmol/L — ABNORMAL LOW (ref 101–111)
GFR calc Af Amer: >60 mL/min (ref >60)
GLUCOSE: 119 mg/dL — AB (ref 65–99)
POTASSIUM: 3.3 mmol/L — AB (ref 3.5–5.1)
Sodium: 137 mmol/L (ref 135–145)
TOTAL PROTEIN: 7.2 g/dL (ref 6.5–8.1)

## 2016-02-04 LAB — URINALYSIS, ROUTINE W REFLEX MICROSCOPIC
BILIRUBIN URINE: NEGATIVE
Glucose, UA: NEGATIVE mg/dL
Hgb urine dipstick: NEGATIVE
KETONES UR: NEGATIVE mg/dL
NITRITE: NEGATIVE
PROTEIN: NEGATIVE mg/dL
Specific Gravity, Urine: 1.017 (ref 1.005–1.030)
pH: 6.5 (ref 5.0–8.0)

## 2016-02-04 LAB — LIPASE, BLOOD: Lipase: 44 U/L (ref 11–51)

## 2016-02-04 LAB — POC URINE PREG, ED: Preg Test, Ur: NEGATIVE

## 2016-02-04 LAB — URINE MICROSCOPIC-ADD ON: RBC / HPF: NONE SEEN RBC/hpf (ref 0–5)

## 2016-02-04 MED ORDER — MORPHINE SULFATE (PF) 4 MG/ML IV SOLN
4.0000 mg | Freq: Once | INTRAVENOUS | Status: AC
  Administered 2016-02-04: 4 mg via INTRAVENOUS
  Filled 2016-02-04: qty 1

## 2016-02-04 MED ORDER — IOPAMIDOL (ISOVUE-300) INJECTION 61%
INTRAVENOUS | Status: AC
  Administered 2016-02-04: 100 mL
  Filled 2016-02-04: qty 100

## 2016-02-04 MED ORDER — SODIUM CHLORIDE 0.9 % IV BOLUS (SEPSIS)
1000.0000 mL | Freq: Once | INTRAVENOUS | Status: AC
  Administered 2016-02-04: 1000 mL via INTRAVENOUS

## 2016-02-04 MED ORDER — ONDANSETRON 4 MG PO TBDP
4.0000 mg | ORAL_TABLET | Freq: Once | ORAL | Status: AC | PRN
  Administered 2016-02-04: 4 mg via ORAL

## 2016-02-04 MED ORDER — ONDANSETRON 4 MG PO TBDP
ORAL_TABLET | ORAL | Status: AC
  Filled 2016-02-04: qty 1

## 2016-02-04 MED ORDER — ONDANSETRON HCL 4 MG/2ML IJ SOLN
4.0000 mg | Freq: Once | INTRAMUSCULAR | Status: AC
  Administered 2016-02-04: 4 mg via INTRAVENOUS
  Filled 2016-02-04: qty 2

## 2016-02-04 NOTE — ED Triage Notes (Signed)
Per EMS: pt coming from home with c/o abdominal pain and right flank pain for the past 3 hours. Pt reports n/v, denies diarrhea

## 2016-02-04 NOTE — ED Triage Notes (Signed)
Pt reports abdominal, right flank, and n/v for 3 hours. Denies diarrhea. Pt denies taking any medications prior to arrival.

## 2016-02-04 NOTE — ED Notes (Signed)
Patient transported to Ultrasound at this time via ED stretcher. Pt in no apparent distress at this time.

## 2016-02-04 NOTE — Discharge Instructions (Signed)
We are unsure what caused your abdominal pain today. Your labs, CT and [place] were all reassuring.  Please follow up with your PCP as soon as possible and return with any concerns.

## 2016-02-04 NOTE — ED Notes (Signed)
Pt taken to CT.

## 2016-02-04 NOTE — ED Notes (Signed)
Pt returned from [place] at this time via ED stretcher. Pt in no apparent distress at this time.  Will continue to closely monitor pt.

## 2016-02-04 NOTE — ED Provider Notes (Signed)
TIME SEEN: 5:30 AM  CHIEF COMPLAINT: Abdominal pain, vomiting  HPI: Pt is a 40 y.o. female with history of IBS who presents to the emergency department with complaints of right quadrant, epigastric abdominal pain that woke her from sleep around 1 AM. Describes the pain as burning, sharp. Does radiate into her right flank. States after eating dinner tonight she did feel uncomfortable with discomfort in her back and felt bloated and had to belch repeatedly. States she ate grilled chicken, salad and pita bread for dinner. States she did have multiple episodes of nonbloody, nonbilious vomiting tonight. No diarrhea. No dysuria, hematuria, vaginal bleeding or discharge. Her last menstrual period was 3 weeks ago. She's had a history of a myomectomy but no other abdominal surgeries. Has never been told she has problems with her gallbladder. No history of kidney stones. Has a low-grade temperature in the emergency department but denies fever or chills at home. She is unaware of any aggravating or alleviating factors.  Drinks alcohol occasionally. No heavy NSAID use.  ROS: See HPI Constitutional:  fever  Eyes: no drainage  ENT: no runny nose   Cardiovascular:  no chest pain  Resp: no SOB  GI: vomiting GU: no dysuria Integumentary: no rash  Allergy: no hives  Musculoskeletal: no leg swelling  Neurological: no slurred speech ROS otherwise negative  PAST MEDICAL HISTORY/PAST SURGICAL HISTORY:  Past Medical History:  Diagnosis Date  . Allergy   . IBS (irritable bowel syndrome)     MEDICATIONS:  Prior to Admission medications   Medication Sig Start Date End Date Taking? Authorizing Provider  Lorcaserin HCl (BELVIQ) 10 MG TABS Take 1 tablet by mouth 2 (two) times daily. 11/13/15   Lucille Passy, MD  phentermine 30 MG capsule Take 1 capsule (30 mg total) by mouth every morning. 01/01/16   Lucille Passy, MD    ALLERGIES:  Allergies  Allergen Reactions  . Belviq [Lorcaserin Hcl] Other (See Comments)   H/A, constipation, fatigue and back pain    SOCIAL HISTORY:  Social History  Substance Use Topics  . Smoking status: Never Smoker  . Smokeless tobacco: Never Used  . Alcohol use No    FAMILY HISTORY: Family History  Problem Relation Age of Onset  . Glaucoma Mother   . Hyperlipidemia Maternal Grandmother   . Hypertension Maternal Grandmother     EXAM: BP 112/74 (BP Location: Left Arm)   Pulse 103   Temp 100.2 F (37.9 C) (Oral)   Resp 18   Ht 5\' 3"  (1.6 m)   Wt 175 lb (79.4 kg)   LMP 01/14/2016 (Approximate)   SpO2 95%   BMI 31.00 kg/m  CONSTITUTIONAL: Alert and oriented and responds appropriately to questions. Well-appearing; well-nourished HEAD: Normocephalic EYES: Conjunctivae clear, PERRL ENT: normal nose; no rhinorrhea; moist mucous membranes NECK: Supple, no meningismus, no LAD  CARD: RRR; S1 and S2 appreciated; no murmurs, no clicks, no rubs, no gallops RESP: Normal chest excursion without splinting or tachypnea; breath sounds clear and equal bilaterally; no wheezes, no rhonchi, no rales, no hypoxia or respiratory distress, speaking full sentences ABD/GI: Normal bowel sounds; non-distended; soft, tender to palpation in the epigastric region and right upper quadrant with voluntary guarding, she has a positive Murphy sign, no rebound, no peritoneal signs; no tenderness at McBurney's point BACK:  The back appears normal and is non-tender to palpation, there is no CVA tenderness EXT: Normal ROM in all joints; non-tender to palpation; no edema; normal capillary refill; no cyanosis, no  calf tenderness or swelling    SKIN: Normal color for age and race; warm; no rash NEURO: Moves all extremities equally, sensation to light touch intact diffusely, cranial nerves II through XII intact PSYCH: The patient's mood and manner are appropriate. Grooming and personal hygiene are appropriate.  MEDICAL DECISION MAKING: Patient here mildly elevated temperature of 100.2, mildly elevated  AST and ALT and right upper quadrant pain. I'm concerned for cholecystitis versus biliary colic versus gastritis. We'll give her IV fluids, pain and nausea medicine. She is NPO.  We'll obtain a right upper quadrant ultrasound.  ED PROGRESS: 7:30 AM  Patient's right upper quadrant ultrasound is normal. She is still having upper abdominal pain, flank pain. Urine does show leukocytes but no other sign of infection. No blood to suggest stone. Discussed with her that this could be gastritis, viral illness but given she is still having significant amount of pain with guarding and had a temperature of 100.2 in the emergency department, will obtain a CT of her abdomen and pelvis for further evaluation. She declines further pain medication at this time.  Signed out to Dr. Sandi Mariscal to follow up on CT results.     Nelson, DO 02/04/16 [phone]

## 2016-02-04 NOTE — ED Notes (Signed)
Pt returned from CT °

## 2016-02-05 LAB — URINE CULTURE: Culture: <10000 — AB

## 2016-02-06 ENCOUNTER — Telehealth (HOSPITAL_BASED_OUTPATIENT_CLINIC_OR_DEPARTMENT_OTHER): Payer: Self-pay

## 2016-02-09 ENCOUNTER — Encounter: Payer: Self-pay | Admitting: Family Medicine

## 2016-02-09 ENCOUNTER — Ambulatory Visit (INDEPENDENT_AMBULATORY_CARE_PROVIDER_SITE_OTHER): Payer: Managed Care, Other (non HMO) | Admitting: Family Medicine

## 2016-02-09 VITALS — BP 116/72 | HR 69 | Temp 97.4°F | Wt 180.2 lb

## 2016-02-09 DIAGNOSIS — Z13220 Encounter for screening for lipoid disorders: Secondary | ICD-10-CM | POA: Diagnosis not present

## 2016-02-09 DIAGNOSIS — R7989 Other specified abnormal findings of blood chemistry: Secondary | ICD-10-CM | POA: Diagnosis not present

## 2016-02-09 DIAGNOSIS — R1084 Generalized abdominal pain: Secondary | ICD-10-CM | POA: Diagnosis not present

## 2016-02-09 DIAGNOSIS — R945 Abnormal results of liver function studies: Secondary | ICD-10-CM

## 2016-02-09 LAB — CBC WITH DIFFERENTIAL/PLATELET
BASOS ABS: 0 10*3/uL (ref 0.0–0.1)
BASOS PCT: 0.5 % (ref 0.0–3.0)
Eosinophils Absolute: 0 10*3/uL (ref 0.0–0.7)
Eosinophils Relative: 0.7 % (ref 0.0–5.0)
HEMATOCRIT: 37.7 % (ref 36.0–46.0)
Hemoglobin: 12.8 g/dL (ref 12.0–15.0)
LYMPHS ABS: 1.2 10*3/uL (ref 0.7–4.0)
Lymphocytes Relative: 23.6 % (ref 12.0–46.0)
MCHC: 33.8 g/dL (ref 30.0–36.0)
MCV: 87.7 fl (ref 78.0–100.0)
MONOS PCT: 9.9 % (ref 3.0–12.0)
Monocytes Absolute: 0.5 10*3/uL (ref 0.1–1.0)
NEUTROS ABS: 3.4 10*3/uL (ref 1.4–7.7)
NEUTROS PCT: 65.3 % (ref 43.0–77.0)
PLATELETS: 324 10*3/uL (ref 150.0–400.0)
RBC: 4.3 Mil/uL (ref 3.87–5.11)
RDW: 14.8 % (ref 11.5–15.5)
WBC: 5.2 10*3/uL (ref 4.0–10.5)

## 2016-02-09 LAB — LIPID PANEL
CHOL/HDL RATIO: 6
Cholesterol: 199 mg/dL (ref 0–200)
HDL: 34.1 mg/dL — AB (ref >39.00)
LDL Cholesterol: 138 mg/dL — ABNORMAL HIGH (ref 0–99)
NONHDL: 164.66
Triglycerides: 131 mg/dL (ref 0.0–149.0)
VLDL: 26.2 mg/dL (ref 0.0–40.0)

## 2016-02-09 LAB — HEPATIC FUNCTION PANEL
ALK PHOS: 52 U/L (ref 39–117)
ALT: 76 U/L — ABNORMAL HIGH (ref 0–35)
AST: 17 U/L (ref 0–37)
Albumin: 4.2 g/dL (ref 3.5–5.2)
BILIRUBIN DIRECT: 0.1 mg/dL (ref 0.0–0.3)
BILIRUBIN TOTAL: 0.6 mg/dL (ref 0.2–1.2)
TOTAL PROTEIN: 7.8 g/dL (ref 6.0–8.3)

## 2016-02-09 NOTE — Assessment & Plan Note (Signed)
Resolved. Work up reassuring. ? gastritis vs gastroenteritis. Has appt with GI scheduled.

## 2016-02-09 NOTE — Progress Notes (Signed)
Pre visit review using our clinic review tool, if applicable. No additional management support is needed unless otherwise documented below in the visit note. 

## 2016-02-09 NOTE — Progress Notes (Signed)
Subjective:   Patient ID: Suzanne Ray, female    DOB: 08/24/1975, 40 y.o.   MRN: [id number]  Suzanne Ray is a pleasant 40 y.o. year old female who presents to clinic today with Hospitalization Follow-up  on 02/09/2016  HPI:  Was seen in ER on 02/04/16- note reviewed.  Presented with RUQ pain which started shortly after eating.  It was associated with vomiting. No dysuria, hematuria.  Temp mildly elevated in ED- 100.2, mildly elevated AST and ALT.  RUQ [place] normal. UA - pos LE, otherwise neg.  Urine cx showed insignificant growth. Lipase, CBC unremarkable.  CT abdomen showed some pelvic free fluid and collapsing right ovarian cyst.  Has had no further vomiting or abdominal pain since.  Ct Abdomen Pelvis W Contrast  Result Date: 02/04/2016 CLINICAL DATA:  40 year old female with mid abdominal pain radiating to the right flank since last night, progressive. Nausea vomiting. Initial encounter. EXAM: CT ABDOMEN AND PELVIS WITH CONTRAST TECHNIQUE: Multidetector CT imaging of the abdomen and pelvis was performed using the standard protocol following bolus administration of intravenous contrast. CONTRAST:  175mL ISOVUE-300 IOPAMIDOL (ISOVUE-300) INJECTION 61% COMPARISON:  Right upper quadrant ultrasound today at 0656 hours. FINDINGS: There is a round 1.9 cm simple fluid density area along the inferior right hilum (series 2, image 9 and series 5, image 52). Otherwise negative lung bases; mild motion artifact and minor dependent atelectasis. No pericardial or pleural effusion. No acute osseous abnormality identified. Small volume pelvic free fluid in the cul-de-sac. The uterus appears mildly enlarged, and there are multiple small areas of myometrial hyper enhancement (sagittal image 50). Individually these areas measure up to about 2 cm. There appears be fluid in the endometrial cavity. Physiologic appearance of both ovaries; collapsing cyst suspected in the left ovary (series 2, image 63).  Unremarkable urinary bladder. Decompressed rectum. Mildly redundant sigmoid colon, also mostly decompressed. Negative left colon, mild retained stool. Negative transverse colon, mildly gas distended. Negative right colon. The cecum is located near the midline. Negative terminal ileum. Normal appendix (series 2, image 53 and coronal image 24. There is trace free fluid near the tip of the cecum just to the left of midline, but this is also in proximity to the left ovary. No dilated small bowel. Negative stomach and duodenum. No abdominal free air. Mildly decreased density throughout the liver. There is a tiny low-density area in the right hepatic measuring 4-5 mm (series 2, image 33) which is likely a benign cyst. Lobe otherwise negative liver, gallbladder, spleen, pancreas and adrenal glands. Portal venous system is patent. Major arterial structures are patent. Bilateral renal enhancement is normal, occasional tiny low-density areas which most likely are benign cysts. No nephrolithiasis is evident. No hydronephrosis or proximal hydroureter. Multiple small calcific foci in the pelvis most likely are phleboliths. No lymphadenopathy. Nonspecific widespread subcutaneous fat stranding along the abdominal wall. IMPRESSION: 1. Small volume of pelvic and left lower abdomen free fluid is nonspecific but felt to be physiologic. Collapsing cyst noted in the left ovary. Small uterine fibroids. 2. Otherwise no acute or inflammatory process within the abdomen or pelvis. Normal appendix. No inflamed or obstructed bowel. 3. Small 1.9 cm probable duplication cyst near the right pulmonary hilum, inconsequential. 4. Nonspecific fairly generalized abdominal wall subcutaneous stranding. Electronically Signed   By: Genevie Ann M.D.   On: 02/04/2016 09:01   US Abdomen Limited Ruq  Result Date: 02/04/2016 CLINICAL DATA:  Acute onset of right upper quadrant abdominal pain. Initial encounter. EXAM: US ABDOMEN  LIMITED - RIGHT UPPER QUADRANT  COMPARISON:  None. FINDINGS: Gallbladder: No gallstones or wall thickening visualized. No sonographic Murphy sign noted by sonographer. Common bile duct: Diameter: 0.4 cm, within normal limits in caliber. Liver: No focal lesion identified. Within normal limits in parenchymal echogenicity. IMPRESSION: Unremarkable ultrasound of the right upper quadrant. Electronically Signed   By: Garald Balding M.D.   On: 02/04/2016 07:13   Current Outpatient Prescriptions on File Prior to Visit  Medication Sig Dispense Refill  . Norethindrone Acetate-Ethinyl Estrad-FE (LOESTRIN 24 FE) 1-20 MG-MCG(24) tablet Take 1 tablet by mouth daily.    . phentermine 30 MG capsule Take 1 capsule (30 mg total) by mouth every morning. 30 capsule 0   No current facility-administered medications on file prior to visit.     Allergies  Allergen Reactions  . Belviq [Lorcaserin Hcl] Other (See Comments)    H/A, constipation, fatigue and back pain    Past Medical History:  Diagnosis Date  . Allergy   . IBS (irritable bowel syndrome)     Past Surgical History:  Procedure Laterality Date  . ROBOT ASSISTED MYOMECTOMY  07/15/2015  . WISDOM TOOTH EXTRACTION     x4    Family History  Problem Relation Age of Onset  . Glaucoma Mother   . Hyperlipidemia Maternal Grandmother   . Hypertension Maternal Grandmother     Social History   Social History  . Marital status: Single    Spouse name: N/A  . Number of children: 1  . Years of education: N/A   Occupational History  .  [employer]   Social History Main Topics  . Smoking status: Never Smoker  . Smokeless tobacco: Never Used  . Alcohol use No  . Drug use: No  . Sexual activity: No   Other Topics Concern  . Not on file   Social History Narrative   One son, Dorris Fetch         The PMH, PSH, Social History, Family History, Medications, and allergies have been reviewed in [facility], and have been updated if relevant.   Review of Systems  Constitutional:  Negative.   HENT: Negative.   Gastrointestinal: Negative.   Genitourinary: Negative.   Musculoskeletal: Negative.   Neurological: Negative.   Psychiatric/Behavioral: Negative.   All other systems reviewed and are negative.      Objective:    BP 116/72   Pulse 69   Temp 97.4 F (36.3 C) (Oral)   Wt 180 lb 4 oz (81.8 kg)   LMP 01/14/2016 (Approximate)   SpO2 99%   BMI 31.93 kg/m    Physical Exam  Constitutional: She is oriented to person, place, and time. She appears well-developed and well-nourished. No distress.  HENT:  Head: Normocephalic.  Eyes: Conjunctivae are normal.  Pulmonary/Chest: Effort normal.  Abdominal: Soft. Bowel sounds are normal. She exhibits no distension. There is no tenderness. There is no rebound and no guarding.  Neurological: She is alert and oriented to person, place, and time. No cranial nerve deficit.  Skin: Skin is warm. She is not diaphoretic.  Psychiatric: Her behavior is normal. Judgment and thought content normal.  Nursing note and vitals reviewed.         Assessment & Plan:   Generalized abdominal pain No Follow-up on file.

## 2016-02-09 NOTE — Patient Instructions (Signed)
Great to see you. We will call you with your lab results and you can view them online.  

## 2016-02-09 NOTE — Assessment & Plan Note (Signed)
Likely secondary to acute gastritis/gastroenteritis. Repeat LFTs today.

## 2016-02-18 ENCOUNTER — Encounter: Payer: Managed Care, Other (non HMO) | Admitting: Family Medicine

## 2016-05-03 ENCOUNTER — Ambulatory Visit (INDEPENDENT_AMBULATORY_CARE_PROVIDER_SITE_OTHER): Payer: Managed Care, Other (non HMO) | Admitting: Family Medicine

## 2016-05-03 ENCOUNTER — Encounter: Payer: Self-pay | Admitting: Family Medicine

## 2016-05-03 VITALS — BP 118/76 | HR 98 | Temp 99.1°F | Wt 180.7 lb

## 2016-05-03 DIAGNOSIS — B028 Zoster with other complications: Secondary | ICD-10-CM

## 2016-05-03 MED ORDER — ACYCLOVIR 200 MG PO CAPS: 200.0000 mg | ORAL_CAPSULE | Freq: Every day | ORAL | 2 refills | Status: DC

## 2016-05-03 NOTE — Progress Notes (Signed)
Suzanne Ray is a 40 year old female who goes to the [employer]. She called there today with a rash but they were said they were full and couldn't see her.  10 days ago she developed a burning sensation in her right flank area. A couple days after that she noticed a rash that then radiated to the upper abdomen. Now there is a burning and itching sensation.  She had chickenpox as a child  Current medications are Synthroid 50 g daily which she started in October from [place] because of Hashimoto's thyroiditis. She's also on Amatine is a 1 twice a day for the past 2 months.  Physical examination.vs  BP 118/76 (BP Location: Right Arm, Patient Position: Sitting, Cuff Size: Normal)   Pulse 98   Temp 99.1 F (37.3 C) (Oral)   Wt 180 lb 11.2 oz (82 kg)   LMP 04/11/2016 (Approximate)   SpO2 98%   BMI 32.01 kg/m  Examination of the skin shows a very slight cluster of lesions right flank with some radiation around the nerve root consistent with zoster  Impression zoster............ acyclovir as outlined

## 2016-05-03 NOTE — Progress Notes (Signed)
Pre visit review using our clinic review tool, if applicable. No additional management support is needed unless otherwise documented below in the visit note. 

## 2016-05-03 NOTE — Patient Instructions (Addendum)
Acyclovir 200 mg,,,,,,,,,,, 3 tablets in the morning,,,,,,, 2 at bedtime 10 days refills 2 as needed  I would address your concern about being sent to [place] with the office manager at [facility]

## 2016-09-20 ENCOUNTER — Telehealth: Payer: Self-pay

## 2016-09-20 NOTE — Telephone Encounter (Signed)
Pt was requesting copy of immunizations; I advised pt do not see any immunizations listed and looked under media and I do not see immunizations . Pt will ck with college to see if they have immunization record and pt will cb if needed.

## 2016-09-29 ENCOUNTER — Telehealth: Payer: Self-pay | Admitting: Family Medicine

## 2016-09-29 NOTE — Telephone Encounter (Signed)
Pt dropped off vaccine nformation required for school as well as vaccine history that she got from her previous school. She also dropped off a copy of her vaccine card from when she was a child.  She has appt scheduled on 05/07 to discuss further needed vaccines. Placing information in rx tower   Thank you

## 2016-10-01 NOTE — Telephone Encounter (Signed)
I entered her immunizations in [place]. I will put her forms with her [facility] sheet for Monday.

## 2016-10-04 ENCOUNTER — Ambulatory Visit (INDEPENDENT_AMBULATORY_CARE_PROVIDER_SITE_OTHER): Payer: Managed Care, Other (non HMO) | Admitting: Family Medicine

## 2016-10-04 ENCOUNTER — Encounter: Payer: Self-pay | Admitting: Family Medicine

## 2016-10-04 VITALS — BP 100/70 | HR 81 | Temp 97.4°F | Wt 175.0 lb

## 2016-10-04 DIAGNOSIS — Z0289 Encounter for other administrative examinations: Secondary | ICD-10-CM | POA: Diagnosis not present

## 2016-10-04 DIAGNOSIS — Z23 Encounter for immunization: Secondary | ICD-10-CM | POA: Diagnosis not present

## 2016-10-04 NOTE — Progress Notes (Signed)
   Subjective:   Patient ID: Lee Kalt, female    DOB: 01/18/1976, 41 y.o.   MRN: [id number]  Shanecia Hoganson is a pleasant 41 y.o. year old female who presents to clinic today with [place] (Pt is needed form filled out for [employer]. 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  Needs medical forms reviewed, filled out and 2 required immunizations- meningitis and Tdap.    Current Outpatient Prescriptions on File Prior to Visit  Medication Sig Dispense Refill  . AMITIZA 24 MCG capsule     . levothyroxine (SYNTHROID, LEVOTHROID) 50 MCG tablet Take by mouth.     No current facility-administered medications on file prior to visit.     Allergies  Allergen Reactions  . Belviq [Lorcaserin Hcl] Other (See Comments)    H/A, constipation, fatigue and back pain    Past Medical History:  Diagnosis Date  . Allergy   . IBS (irritable bowel syndrome)     Past Surgical History:  Procedure Laterality Date  . ROBOT ASSISTED MYOMECTOMY  07/15/2015  . WISDOM TOOTH EXTRACTION     x4    Family History  Problem Relation Age of Onset  . Glaucoma Mother   . Hyperlipidemia Maternal Grandmother   . Hypertension Maternal Grandmother     Social History   Social History  . Marital status: Single    Spouse name: N/A  . Number of children: 1  . Years of education: N/A   Occupational History  .  [employer]   Social History Main Topics  . Smoking status: Never Smoker  . Smokeless tobacco: Never Used  . Alcohol use No  . Drug use: No  . Sexual activity: No   Other Topics Concern  . Not on file   Social History Narrative   One son, Dorris Fetch         The PMH, PSH, Social History, Family History, Medications, and allergies have been reviewed in [facility], and have been updated if relevant.   Review of Systems  All other systems reviewed and are negative.      Objective:    BP 100/70 (BP Location: Left Arm, Patient  Position: Sitting, Cuff Size: Large)   Pulse 81   Temp 97.4 F (36.3 C) (Oral)   Wt 175 lb (79.4 kg)   SpO2 98%   BMI 31.00 kg/m    Physical Exam  Constitutional: She is oriented to person, place, and time. She appears well-developed and well-nourished. No distress.  HENT:  Head: Normocephalic and atraumatic.  Eyes: Conjunctivae are normal.  Cardiovascular: Normal rate.   Pulmonary/Chest: Effort normal.  Musculoskeletal: Normal range of motion.  Neurological: She is alert and oriented to person, place, and time. No cranial nerve deficit.  Skin: Skin is warm and dry. She is not diaphoretic.  Psychiatric: She has a normal mood and affect. Her behavior is normal. Judgment and thought content normal.  Nursing note and vitals reviewed.         Assessment & Plan:   Need for Tdap vaccination - Plan: Tdap vaccine greater than or equal to 7yo IM  Need for Menactra vaccination - Plan: MENINGOCOCCAL MCV4O(MENVEO), CANCELED: Meningococcal conjugate vaccine 4-valent IM No Follow-up on file.

## 2016-10-04 NOTE — Progress Notes (Signed)
Pre visit review using our clinic review tool, if applicable. No additional management support is needed unless otherwise documented below in the visit note. 

## 2016-10-04 NOTE — Assessment & Plan Note (Signed)
Form reviewed and filled out with patient. Required vaccinations given today.

## 2016-12-20 ENCOUNTER — Other Ambulatory Visit: Payer: Self-pay | Admitting: Family Medicine

## 2016-12-31 IMAGING — US US ABDOMEN LIMITED
1 series · 14 of 25 positions shown · non-contrast
Comparison: None.

CLINICAL DATA: Acute onset of right upper quadrant abdominal pain.
Initial encounter.

EXAM:
US ABDOMEN LIMITED - RIGHT UPPER QUADRANT

[Series 1: us abdomen limited · 0.22mm/px · 14 of 33 slices shown]
[im 1/33]
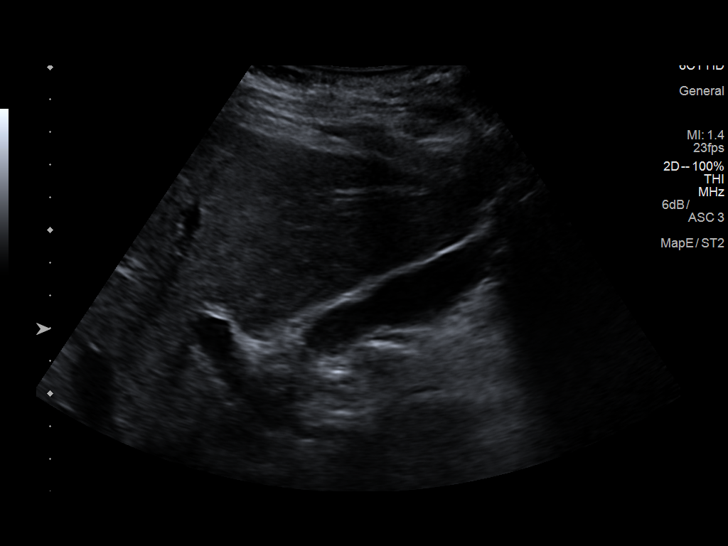
[im 3/33]
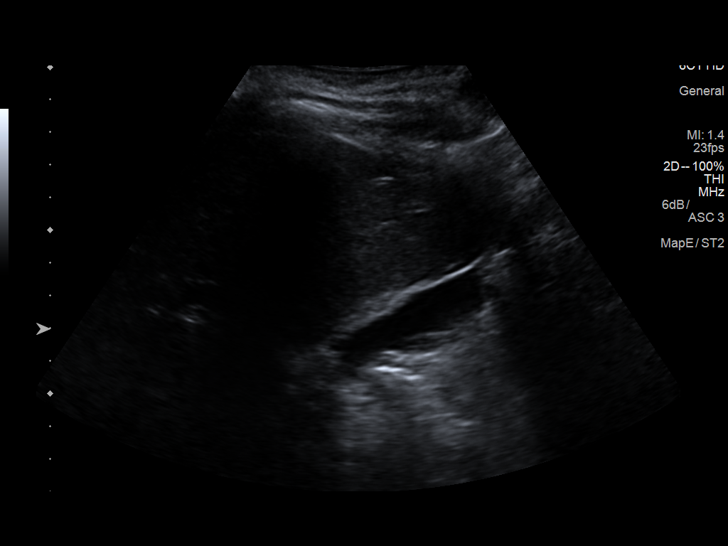
[im 6/33]
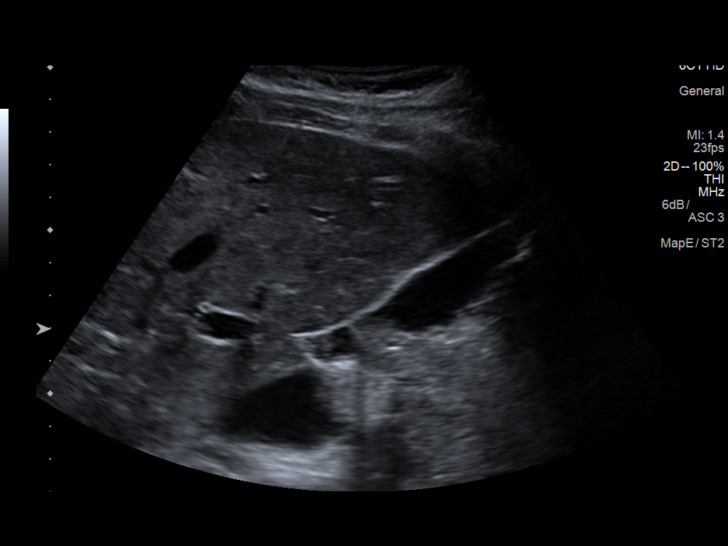
[im 9/33]
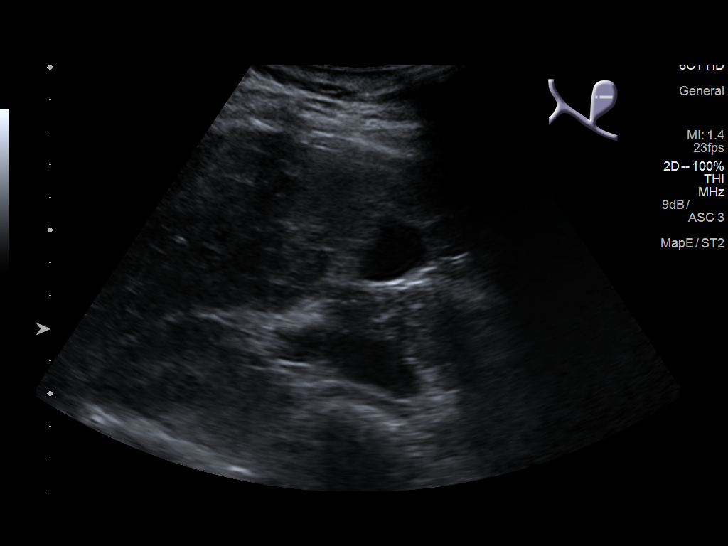
[im 11/33]
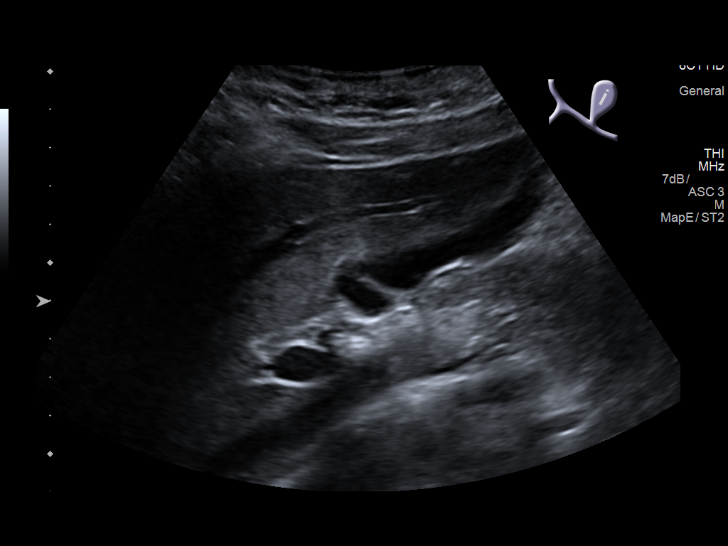
[im 13/33]
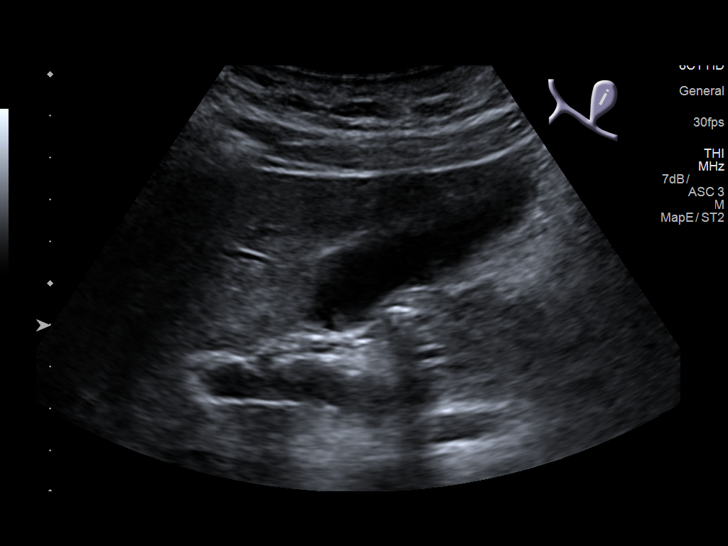
[im 15/33]
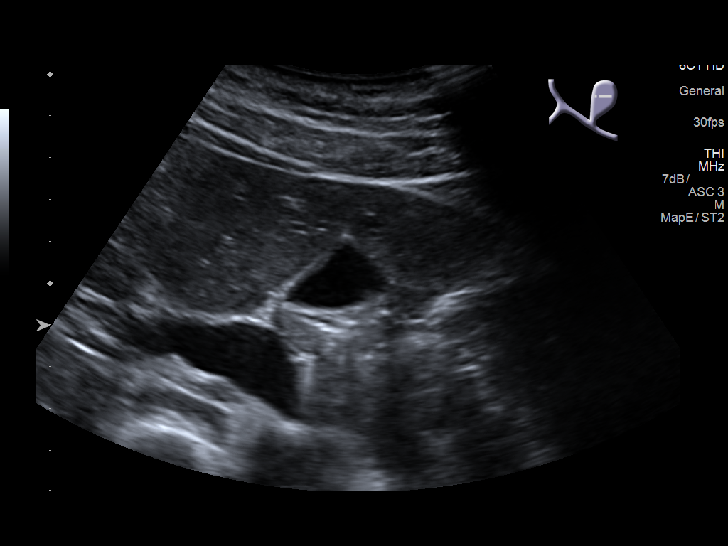
[im 18/33]
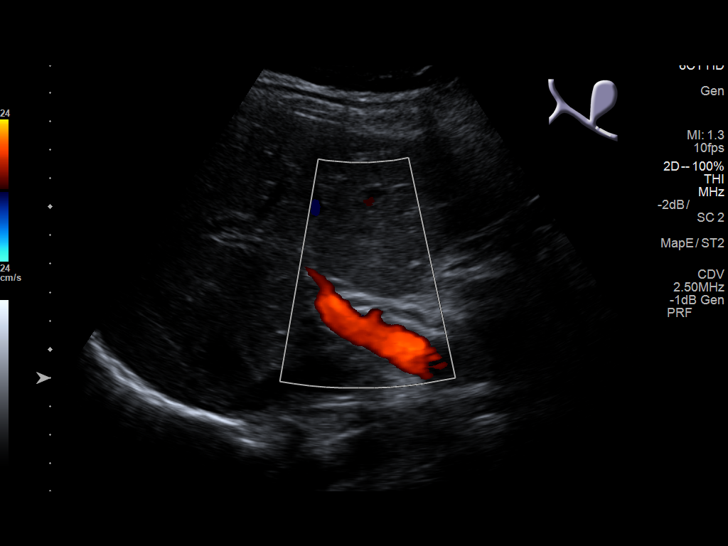
[im 21/33]
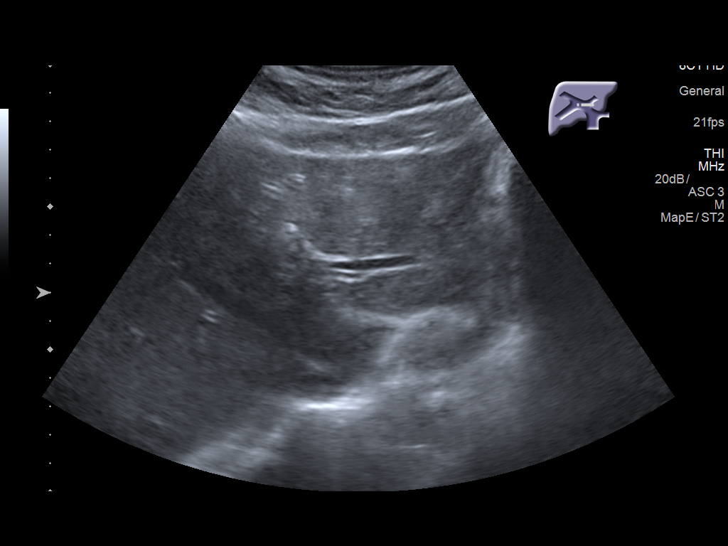
[im 22/33]
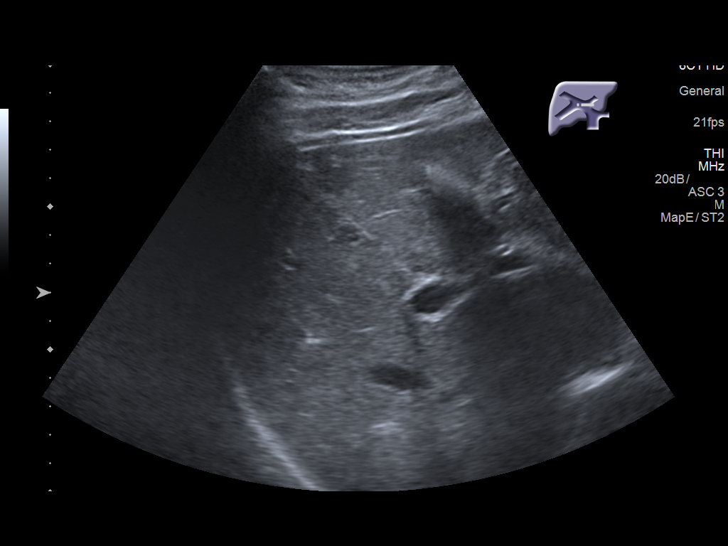
[im 25/33]
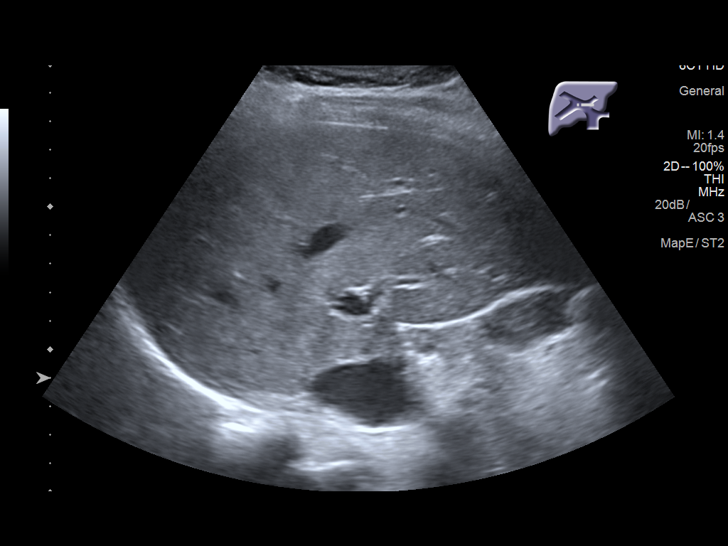
[im 27/33]
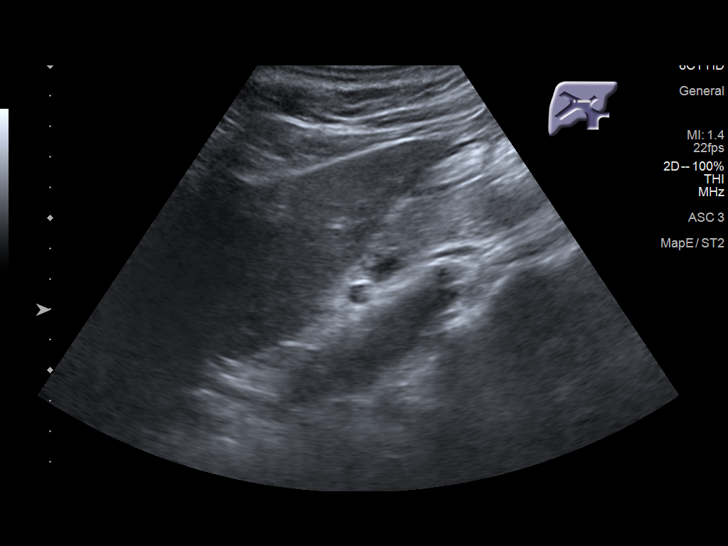
[im 30/33]
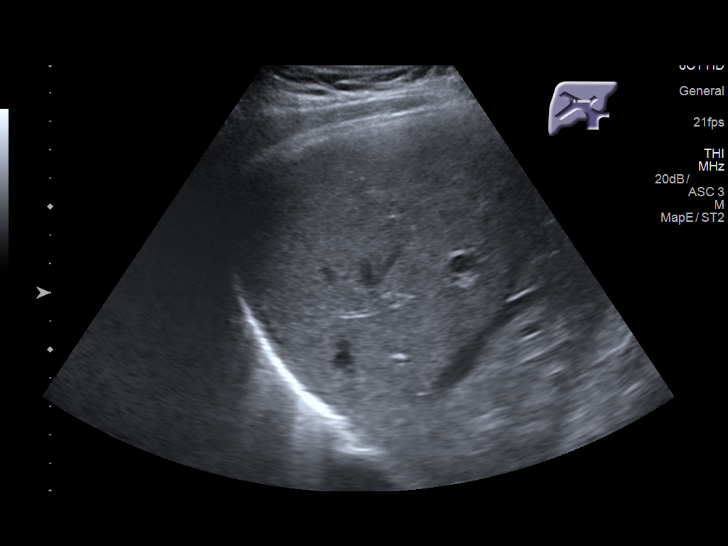
[im 33/33]
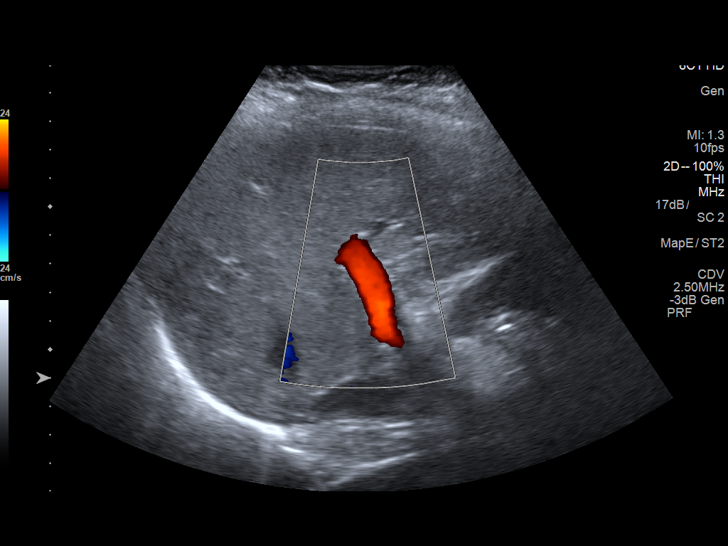

[14 of 25 positions shown; findings below may reference images not displayed]

FINDINGS: Gallbladder:

No gallstones or wall thickening visualized. No sonographic Murphy
sign noted by sonographer.

Common bile duct:

Diameter: 0.4 cm, within normal limits in caliber.

Liver:

No focal lesion identified. Within normal limits in parenchymal
echogenicity.
IMPRESSION: Unremarkable ultrasound of the right upper quadrant.
